# Patient Record
Sex: Female | Born: 1955 | Race: White | Hispanic: No | Smoking: Never smoker
Health system: Southern US, Community
[De-identification: ages and names within clinical notes are randomized; demographics above are authoritative.]

## PROBLEM LIST (undated history)

## (undated) DIAGNOSIS — M810 Age-related osteoporosis without current pathological fracture: Secondary | ICD-10-CM

## (undated) DIAGNOSIS — M5136 Other intervertebral disc degeneration, lumbar region: Secondary | ICD-10-CM

## (undated) DIAGNOSIS — R000 Tachycardia, unspecified: Secondary | ICD-10-CM

## (undated) DIAGNOSIS — R7303 Prediabetes: Secondary | ICD-10-CM

## (undated) DIAGNOSIS — D509 Iron deficiency anemia, unspecified: Secondary | ICD-10-CM

## (undated) DIAGNOSIS — M5416 Radiculopathy, lumbar region: Secondary | ICD-10-CM

## (undated) DIAGNOSIS — K219 Gastro-esophageal reflux disease without esophagitis: Secondary | ICD-10-CM

## (undated) DIAGNOSIS — M51369 Other intervertebral disc degeneration, lumbar region without mention of lumbar back pain or lower extremity pain: Secondary | ICD-10-CM

## (undated) DIAGNOSIS — H8110 Benign paroxysmal vertigo, unspecified ear: Secondary | ICD-10-CM

## (undated) DIAGNOSIS — R112 Nausea with vomiting, unspecified: Secondary | ICD-10-CM

## (undated) DIAGNOSIS — I10 Essential (primary) hypertension: Secondary | ICD-10-CM

## (undated) DIAGNOSIS — L309 Dermatitis, unspecified: Secondary | ICD-10-CM

## (undated) DIAGNOSIS — K644 Residual hemorrhoidal skin tags: Secondary | ICD-10-CM

## (undated) DIAGNOSIS — R002 Palpitations: Secondary | ICD-10-CM

## (undated) DIAGNOSIS — E785 Hyperlipidemia, unspecified: Secondary | ICD-10-CM

## (undated) DIAGNOSIS — T8859XA Other complications of anesthesia, initial encounter: Secondary | ICD-10-CM

## (undated) DIAGNOSIS — D696 Thrombocytopenia, unspecified: Secondary | ICD-10-CM

## (undated) DIAGNOSIS — K7469 Other cirrhosis of liver: Secondary | ICD-10-CM

## (undated) DIAGNOSIS — L409 Psoriasis, unspecified: Secondary | ICD-10-CM

## (undated) DIAGNOSIS — M1990 Unspecified osteoarthritis, unspecified site: Secondary | ICD-10-CM

## (undated) DIAGNOSIS — K7460 Unspecified cirrhosis of liver: Secondary | ICD-10-CM

## (undated) DIAGNOSIS — F419 Anxiety disorder, unspecified: Secondary | ICD-10-CM

## (undated) DIAGNOSIS — M4800 Spinal stenosis, site unspecified: Secondary | ICD-10-CM

## (undated) DIAGNOSIS — M549 Dorsalgia, unspecified: Secondary | ICD-10-CM

## (undated) DIAGNOSIS — Z9889 Other specified postprocedural states: Secondary | ICD-10-CM

## (undated) HISTORY — DX: Tachycardia, unspecified: R00.0

## (undated) HISTORY — DX: Dermatitis, unspecified: L30.9

## (undated) HISTORY — DX: Other intervertebral disc degeneration, lumbar region without mention of lumbar back pain or lower extremity pain: M51.369

## (undated) HISTORY — DX: Radiculopathy, lumbar region: M54.16

## (undated) HISTORY — DX: Thrombocytopenia, unspecified: D69.6

## (undated) HISTORY — DX: Hyperlipidemia, unspecified: E78.5

## (undated) HISTORY — DX: Nonalcoholic steatohepatitis (NASH): K74.60

## (undated) HISTORY — DX: Residual hemorrhoidal skin tags: K64.4

## (undated) HISTORY — DX: Benign paroxysmal vertigo, unspecified ear: H81.10

## (undated) HISTORY — DX: Psoriasis, unspecified: L40.9

## (undated) HISTORY — DX: Iron deficiency anemia, unspecified: D50.9

## (undated) HISTORY — DX: Age-related osteoporosis without current pathological fracture: M81.0

## (undated) HISTORY — DX: Palpitations: R00.2

## (undated) HISTORY — DX: Other intervertebral disc degeneration, lumbar region: M51.36

## (undated) HISTORY — DX: Unspecified osteoarthritis, unspecified site: M19.90

## (undated) HISTORY — PX: COLONOSCOPY: SHX174

## (undated) HISTORY — DX: Other cirrhosis of liver: K74.69

---

## 2015-09-20 DIAGNOSIS — M25572 Pain in left ankle and joints of left foot: Secondary | ICD-10-CM | POA: Diagnosis not present

## 2015-11-09 DIAGNOSIS — M5136 Other intervertebral disc degeneration, lumbar region: Secondary | ICD-10-CM | POA: Diagnosis not present

## 2015-11-13 DIAGNOSIS — M5126 Other intervertebral disc displacement, lumbar region: Secondary | ICD-10-CM | POA: Diagnosis not present

## 2015-11-13 DIAGNOSIS — M5136 Other intervertebral disc degeneration, lumbar region: Secondary | ICD-10-CM | POA: Diagnosis not present

## 2015-11-13 DIAGNOSIS — M4806 Spinal stenosis, lumbar region: Secondary | ICD-10-CM | POA: Diagnosis not present

## 2015-11-14 DIAGNOSIS — J0100 Acute maxillary sinusitis, unspecified: Secondary | ICD-10-CM | POA: Diagnosis not present

## 2015-11-15 DIAGNOSIS — M5136 Other intervertebral disc degeneration, lumbar region: Secondary | ICD-10-CM | POA: Diagnosis not present

## 2015-11-17 DIAGNOSIS — M5136 Other intervertebral disc degeneration, lumbar region: Secondary | ICD-10-CM | POA: Diagnosis not present

## 2015-11-17 DIAGNOSIS — M5116 Intervertebral disc disorders with radiculopathy, lumbar region: Secondary | ICD-10-CM | POA: Diagnosis not present

## 2015-11-17 DIAGNOSIS — M5416 Radiculopathy, lumbar region: Secondary | ICD-10-CM | POA: Diagnosis not present

## 2015-12-22 DIAGNOSIS — Z1231 Encounter for screening mammogram for malignant neoplasm of breast: Secondary | ICD-10-CM | POA: Diagnosis not present

## 2016-02-15 DIAGNOSIS — M5136 Other intervertebral disc degeneration, lumbar region: Secondary | ICD-10-CM | POA: Diagnosis not present

## 2016-03-16 DIAGNOSIS — R7301 Impaired fasting glucose: Secondary | ICD-10-CM | POA: Diagnosis not present

## 2016-03-16 DIAGNOSIS — F419 Anxiety disorder, unspecified: Secondary | ICD-10-CM | POA: Diagnosis not present

## 2016-03-16 DIAGNOSIS — E785 Hyperlipidemia, unspecified: Secondary | ICD-10-CM | POA: Diagnosis not present

## 2016-03-16 DIAGNOSIS — K760 Fatty (change of) liver, not elsewhere classified: Secondary | ICD-10-CM | POA: Diagnosis not present

## 2016-05-09 DIAGNOSIS — Z1151 Encounter for screening for human papillomavirus (HPV): Secondary | ICD-10-CM | POA: Diagnosis not present

## 2016-05-09 DIAGNOSIS — K644 Residual hemorrhoidal skin tags: Secondary | ICD-10-CM | POA: Diagnosis not present

## 2016-05-09 DIAGNOSIS — Z01419 Encounter for gynecological examination (general) (routine) without abnormal findings: Secondary | ICD-10-CM | POA: Diagnosis not present

## 2016-05-09 DIAGNOSIS — K640 First degree hemorrhoids: Secondary | ICD-10-CM | POA: Diagnosis not present

## 2016-05-27 DIAGNOSIS — H6692 Otitis media, unspecified, left ear: Secondary | ICD-10-CM | POA: Diagnosis not present

## 2016-05-27 DIAGNOSIS — I10 Essential (primary) hypertension: Secondary | ICD-10-CM | POA: Diagnosis not present

## 2016-05-27 DIAGNOSIS — Z6839 Body mass index (BMI) 39.0-39.9, adult: Secondary | ICD-10-CM | POA: Diagnosis not present

## 2016-09-14 DIAGNOSIS — R7301 Impaired fasting glucose: Secondary | ICD-10-CM | POA: Diagnosis not present

## 2016-09-14 DIAGNOSIS — E785 Hyperlipidemia, unspecified: Secondary | ICD-10-CM | POA: Diagnosis not present

## 2016-09-14 DIAGNOSIS — K760 Fatty (change of) liver, not elsewhere classified: Secondary | ICD-10-CM | POA: Diagnosis not present

## 2016-09-14 DIAGNOSIS — F419 Anxiety disorder, unspecified: Secondary | ICD-10-CM | POA: Diagnosis not present

## 2016-09-14 DIAGNOSIS — Z1389 Encounter for screening for other disorder: Secondary | ICD-10-CM | POA: Diagnosis not present

## 2017-03-22 DIAGNOSIS — F419 Anxiety disorder, unspecified: Secondary | ICD-10-CM | POA: Diagnosis not present

## 2017-03-22 DIAGNOSIS — E785 Hyperlipidemia, unspecified: Secondary | ICD-10-CM | POA: Diagnosis not present

## 2017-03-22 DIAGNOSIS — K760 Fatty (change of) liver, not elsewhere classified: Secondary | ICD-10-CM | POA: Diagnosis not present

## 2017-03-22 DIAGNOSIS — R7301 Impaired fasting glucose: Secondary | ICD-10-CM | POA: Diagnosis not present

## 2017-04-18 DIAGNOSIS — H01021 Squamous blepharitis right upper eyelid: Secondary | ICD-10-CM | POA: Diagnosis not present

## 2017-04-18 DIAGNOSIS — H11153 Pinguecula, bilateral: Secondary | ICD-10-CM | POA: Diagnosis not present

## 2017-04-18 DIAGNOSIS — H01024 Squamous blepharitis left upper eyelid: Secondary | ICD-10-CM | POA: Diagnosis not present

## 2017-04-18 DIAGNOSIS — H11441 Conjunctival cysts, right eye: Secondary | ICD-10-CM | POA: Diagnosis not present

## 2017-05-02 DIAGNOSIS — H11153 Pinguecula, bilateral: Secondary | ICD-10-CM | POA: Diagnosis not present

## 2017-05-02 DIAGNOSIS — H11441 Conjunctival cysts, right eye: Secondary | ICD-10-CM | POA: Diagnosis not present

## 2017-05-02 DIAGNOSIS — H01024 Squamous blepharitis left upper eyelid: Secondary | ICD-10-CM | POA: Diagnosis not present

## 2017-05-02 DIAGNOSIS — H01021 Squamous blepharitis right upper eyelid: Secondary | ICD-10-CM | POA: Diagnosis not present

## 2017-07-04 DIAGNOSIS — H11153 Pinguecula, bilateral: Secondary | ICD-10-CM | POA: Diagnosis not present

## 2017-07-04 DIAGNOSIS — H25013 Cortical age-related cataract, bilateral: Secondary | ICD-10-CM | POA: Diagnosis not present

## 2017-07-04 DIAGNOSIS — H52223 Regular astigmatism, bilateral: Secondary | ICD-10-CM | POA: Diagnosis not present

## 2017-07-04 DIAGNOSIS — H1045 Other chronic allergic conjunctivitis: Secondary | ICD-10-CM | POA: Diagnosis not present

## 2017-07-04 DIAGNOSIS — H524 Presbyopia: Secondary | ICD-10-CM | POA: Diagnosis not present

## 2017-07-04 DIAGNOSIS — H5203 Hypermetropia, bilateral: Secondary | ICD-10-CM | POA: Diagnosis not present

## 2017-07-04 DIAGNOSIS — H2513 Age-related nuclear cataract, bilateral: Secondary | ICD-10-CM | POA: Diagnosis not present

## 2017-09-05 DIAGNOSIS — R229 Localized swelling, mass and lump, unspecified: Secondary | ICD-10-CM | POA: Diagnosis not present

## 2017-09-05 DIAGNOSIS — N6321 Unspecified lump in the left breast, upper outer quadrant: Secondary | ICD-10-CM | POA: Diagnosis not present

## 2017-09-05 DIAGNOSIS — R928 Other abnormal and inconclusive findings on diagnostic imaging of breast: Secondary | ICD-10-CM | POA: Diagnosis not present

## 2017-10-10 DIAGNOSIS — E785 Hyperlipidemia, unspecified: Secondary | ICD-10-CM | POA: Diagnosis not present

## 2017-10-10 DIAGNOSIS — K760 Fatty (change of) liver, not elsewhere classified: Secondary | ICD-10-CM | POA: Diagnosis not present

## 2017-10-10 DIAGNOSIS — F419 Anxiety disorder, unspecified: Secondary | ICD-10-CM | POA: Diagnosis not present

## 2017-10-10 DIAGNOSIS — R7301 Impaired fasting glucose: Secondary | ICD-10-CM | POA: Diagnosis not present

## 2018-01-07 DIAGNOSIS — Z01818 Encounter for other preprocedural examination: Secondary | ICD-10-CM | POA: Diagnosis not present

## 2018-02-06 DIAGNOSIS — D125 Benign neoplasm of sigmoid colon: Secondary | ICD-10-CM | POA: Diagnosis not present

## 2018-02-06 DIAGNOSIS — K644 Residual hemorrhoidal skin tags: Secondary | ICD-10-CM | POA: Diagnosis not present

## 2018-02-06 DIAGNOSIS — I10 Essential (primary) hypertension: Secondary | ICD-10-CM | POA: Diagnosis not present

## 2018-02-06 DIAGNOSIS — K635 Polyp of colon: Secondary | ICD-10-CM | POA: Diagnosis not present

## 2018-02-06 DIAGNOSIS — D124 Benign neoplasm of descending colon: Secondary | ICD-10-CM | POA: Diagnosis not present

## 2018-02-06 DIAGNOSIS — Z79899 Other long term (current) drug therapy: Secondary | ICD-10-CM | POA: Diagnosis not present

## 2018-02-06 DIAGNOSIS — M1990 Unspecified osteoarthritis, unspecified site: Secondary | ICD-10-CM | POA: Diagnosis not present

## 2018-02-06 DIAGNOSIS — Z800 Family history of malignant neoplasm of digestive organs: Secondary | ICD-10-CM | POA: Diagnosis not present

## 2018-02-06 DIAGNOSIS — D649 Anemia, unspecified: Secondary | ICD-10-CM | POA: Diagnosis not present

## 2018-02-06 DIAGNOSIS — F419 Anxiety disorder, unspecified: Secondary | ICD-10-CM | POA: Diagnosis not present

## 2018-02-06 DIAGNOSIS — K624 Stenosis of anus and rectum: Secondary | ICD-10-CM | POA: Diagnosis not present

## 2018-02-06 DIAGNOSIS — K7689 Other specified diseases of liver: Secondary | ICD-10-CM | POA: Diagnosis not present

## 2018-02-06 DIAGNOSIS — Z1211 Encounter for screening for malignant neoplasm of colon: Secondary | ICD-10-CM | POA: Diagnosis not present

## 2018-02-06 DIAGNOSIS — K219 Gastro-esophageal reflux disease without esophagitis: Secondary | ICD-10-CM | POA: Diagnosis not present

## 2018-02-15 DIAGNOSIS — L2089 Other atopic dermatitis: Secondary | ICD-10-CM | POA: Diagnosis not present

## 2018-03-13 DIAGNOSIS — R928 Other abnormal and inconclusive findings on diagnostic imaging of breast: Secondary | ICD-10-CM | POA: Diagnosis not present

## 2018-03-13 DIAGNOSIS — N6489 Other specified disorders of breast: Secondary | ICD-10-CM | POA: Diagnosis not present

## 2018-04-25 DIAGNOSIS — Z1331 Encounter for screening for depression: Secondary | ICD-10-CM | POA: Diagnosis not present

## 2018-04-25 DIAGNOSIS — F419 Anxiety disorder, unspecified: Secondary | ICD-10-CM | POA: Diagnosis not present

## 2018-04-25 DIAGNOSIS — R7301 Impaired fasting glucose: Secondary | ICD-10-CM | POA: Diagnosis not present

## 2018-04-25 DIAGNOSIS — K760 Fatty (change of) liver, not elsewhere classified: Secondary | ICD-10-CM | POA: Diagnosis not present

## 2018-04-25 DIAGNOSIS — E785 Hyperlipidemia, unspecified: Secondary | ICD-10-CM | POA: Diagnosis not present

## 2018-05-02 DIAGNOSIS — R161 Splenomegaly, not elsewhere classified: Secondary | ICD-10-CM | POA: Diagnosis not present

## 2018-05-02 DIAGNOSIS — K760 Fatty (change of) liver, not elsewhere classified: Secondary | ICD-10-CM | POA: Diagnosis not present

## 2018-05-02 DIAGNOSIS — K8020 Calculus of gallbladder without cholecystitis without obstruction: Secondary | ICD-10-CM | POA: Diagnosis not present

## 2018-11-27 DIAGNOSIS — F419 Anxiety disorder, unspecified: Secondary | ICD-10-CM | POA: Diagnosis not present

## 2018-11-27 DIAGNOSIS — K760 Fatty (change of) liver, not elsewhere classified: Secondary | ICD-10-CM | POA: Diagnosis not present

## 2018-11-27 DIAGNOSIS — E785 Hyperlipidemia, unspecified: Secondary | ICD-10-CM | POA: Diagnosis not present

## 2018-12-11 DIAGNOSIS — R7301 Impaired fasting glucose: Secondary | ICD-10-CM | POA: Diagnosis not present

## 2019-02-19 DIAGNOSIS — Z1231 Encounter for screening mammogram for malignant neoplasm of breast: Secondary | ICD-10-CM | POA: Diagnosis not present

## 2019-04-12 DIAGNOSIS — L820 Inflamed seborrheic keratosis: Secondary | ICD-10-CM | POA: Diagnosis not present

## 2019-04-12 DIAGNOSIS — L739 Follicular disorder, unspecified: Secondary | ICD-10-CM | POA: Diagnosis not present

## 2019-04-12 DIAGNOSIS — L570 Actinic keratosis: Secondary | ICD-10-CM | POA: Diagnosis not present

## 2020-02-10 DIAGNOSIS — H25013 Cortical age-related cataract, bilateral: Secondary | ICD-10-CM | POA: Diagnosis not present

## 2020-02-10 DIAGNOSIS — H2513 Age-related nuclear cataract, bilateral: Secondary | ICD-10-CM | POA: Diagnosis not present

## 2020-02-10 DIAGNOSIS — H40013 Open angle with borderline findings, low risk, bilateral: Secondary | ICD-10-CM | POA: Diagnosis not present

## 2020-02-10 DIAGNOSIS — H5213 Myopia, bilateral: Secondary | ICD-10-CM | POA: Diagnosis not present

## 2020-04-27 DIAGNOSIS — H2513 Age-related nuclear cataract, bilateral: Secondary | ICD-10-CM | POA: Diagnosis not present

## 2020-04-27 DIAGNOSIS — H25013 Cortical age-related cataract, bilateral: Secondary | ICD-10-CM | POA: Diagnosis not present

## 2020-04-27 DIAGNOSIS — H40013 Open angle with borderline findings, low risk, bilateral: Secondary | ICD-10-CM | POA: Diagnosis not present

## 2020-05-23 DIAGNOSIS — Z1231 Encounter for screening mammogram for malignant neoplasm of breast: Secondary | ICD-10-CM | POA: Diagnosis not present

## 2020-08-11 DIAGNOSIS — K760 Fatty (change of) liver, not elsewhere classified: Secondary | ICD-10-CM | POA: Diagnosis not present

## 2020-08-11 DIAGNOSIS — E785 Hyperlipidemia, unspecified: Secondary | ICD-10-CM | POA: Diagnosis not present

## 2020-08-11 DIAGNOSIS — I10 Essential (primary) hypertension: Secondary | ICD-10-CM | POA: Diagnosis not present

## 2020-08-11 DIAGNOSIS — F419 Anxiety disorder, unspecified: Secondary | ICD-10-CM | POA: Diagnosis not present

## 2020-08-11 DIAGNOSIS — Z23 Encounter for immunization: Secondary | ICD-10-CM | POA: Diagnosis not present

## 2020-08-11 DIAGNOSIS — K219 Gastro-esophageal reflux disease without esophagitis: Secondary | ICD-10-CM | POA: Diagnosis not present

## 2020-08-11 DIAGNOSIS — R7301 Impaired fasting glucose: Secondary | ICD-10-CM | POA: Diagnosis not present

## 2020-08-11 DIAGNOSIS — Z1331 Encounter for screening for depression: Secondary | ICD-10-CM | POA: Diagnosis not present

## 2020-08-18 DIAGNOSIS — K760 Fatty (change of) liver, not elsewhere classified: Secondary | ICD-10-CM | POA: Diagnosis not present

## 2020-08-18 DIAGNOSIS — K8020 Calculus of gallbladder without cholecystitis without obstruction: Secondary | ICD-10-CM | POA: Diagnosis not present

## 2020-09-18 ENCOUNTER — Emergency Department (HOSPITAL_BASED_OUTPATIENT_CLINIC_OR_DEPARTMENT_OTHER)
Admission: EM | Admit: 2020-09-18 | Discharge: 2020-09-18 | Disposition: A | Discharge status: Home / Self Care | Payer: Medicare HMO | Attending: Emergency Medicine | Admitting: Emergency Medicine

## 2020-09-18 ENCOUNTER — Emergency Department (HOSPITAL_BASED_OUTPATIENT_CLINIC_OR_DEPARTMENT_OTHER): Payer: Medicare HMO

## 2020-09-18 ENCOUNTER — Other Ambulatory Visit: Payer: Self-pay

## 2020-09-18 ENCOUNTER — Encounter (HOSPITAL_BASED_OUTPATIENT_CLINIC_OR_DEPARTMENT_OTHER): Payer: Self-pay | Admitting: *Deleted

## 2020-09-18 DIAGNOSIS — W1839XA Other fall on same level, initial encounter: Secondary | ICD-10-CM | POA: Diagnosis not present

## 2020-09-18 DIAGNOSIS — S42211A Unspecified displaced fracture of surgical neck of right humerus, initial encounter for closed fracture: Secondary | ICD-10-CM | POA: Diagnosis not present

## 2020-09-18 DIAGNOSIS — S42252A Displaced fracture of greater tuberosity of left humerus, initial encounter for closed fracture: Secondary | ICD-10-CM | POA: Diagnosis not present

## 2020-09-18 DIAGNOSIS — S42201A Unspecified fracture of upper end of right humerus, initial encounter for closed fracture: Secondary | ICD-10-CM | POA: Insufficient documentation

## 2020-09-18 DIAGNOSIS — M25512 Pain in left shoulder: Secondary | ICD-10-CM | POA: Diagnosis not present

## 2020-09-18 DIAGNOSIS — S42292A Other displaced fracture of upper end of left humerus, initial encounter for closed fracture: Secondary | ICD-10-CM | POA: Diagnosis not present

## 2020-09-18 DIAGNOSIS — S42291A Other displaced fracture of upper end of right humerus, initial encounter for closed fracture: Secondary | ICD-10-CM

## 2020-09-18 DIAGNOSIS — S42202A Unspecified fracture of upper end of left humerus, initial encounter for closed fracture: Secondary | ICD-10-CM | POA: Insufficient documentation

## 2020-09-18 DIAGNOSIS — I10 Essential (primary) hypertension: Secondary | ICD-10-CM | POA: Insufficient documentation

## 2020-09-18 DIAGNOSIS — S4991XA Unspecified injury of right shoulder and upper arm, initial encounter: Secondary | ICD-10-CM | POA: Diagnosis present

## 2020-09-18 DIAGNOSIS — S42251A Displaced fracture of greater tuberosity of right humerus, initial encounter for closed fracture: Secondary | ICD-10-CM | POA: Diagnosis not present

## 2020-09-18 DIAGNOSIS — S42212A Unspecified displaced fracture of surgical neck of left humerus, initial encounter for closed fracture: Secondary | ICD-10-CM | POA: Diagnosis not present

## 2020-09-18 HISTORY — DX: Spinal stenosis, site unspecified: M48.00

## 2020-09-18 HISTORY — DX: Dorsalgia, unspecified: M54.9

## 2020-09-18 HISTORY — DX: Essential (primary) hypertension: I10

## 2020-09-18 HISTORY — DX: Gastro-esophageal reflux disease without esophagitis: K21.9

## 2020-09-18 MED ORDER — MORPHINE SULFATE 15 MG PO TABS: 7.5000 mg | ORAL_TABLET | ORAL | 0 refills | Status: DC | PRN

## 2020-09-18 MED ORDER — ACETAMINOPHEN 500 MG PO TABS
1000.0000 mg | ORAL_TABLET | Freq: Once | ORAL | Status: AC
  Administered 2020-09-18: 1000 mg via ORAL
  Filled 2020-09-18: qty 2

## 2020-09-18 MED ORDER — OXYCODONE HCL 5 MG PO TABS
5.0000 mg | ORAL_TABLET | Freq: Once | ORAL | Status: AC
  Administered 2020-09-18: 5 mg via ORAL
  Filled 2020-09-18: qty 1

## 2020-09-18 NOTE — ED Provider Notes (Signed)
[facility] EMERGENCY DEPARTMENT Provider Note   CSN: [id number] Arrival date & time: 09/18/20  1255     History Chief Complaint  Patient presents with  . Fall    Abigail Luna is a 65 y.o. female.  65 yo F with a chief complaint of a fall.  The patient tripped over a concrete divider between parking spaces and landed on her left shoulder.  Complaining of pain to the left shoulder and having difficulty lifting it up.  She also has pain on the right side as well.  Denies head injury denies neck pain denies chest pain denies abdominal pain denies back pain.  The history is provided by the patient.  Fall This is a new problem. The current episode started 1 to 2 hours ago. The problem occurs constantly. The problem has not changed since onset.Pertinent negatives include no chest pain, no headaches and no shortness of breath. The symptoms are aggravated by bending and twisting. Nothing relieves the symptoms. She has tried nothing for the symptoms. The treatment provided no relief.       Past Medical History:  Diagnosis Date  . Back pain   . GERD (gastroesophageal reflux disease)   . Hypertension   . Spinal stenosis     There are no problems to display for this patient.   History reviewed. No pertinent surgical history.   OB History   No obstetric history on file.     No family history on file.  Social History   Tobacco Use  . Smoking status: Never Smoker  Vaping Use  . Vaping Use: Never used  Substance Use Topics  . Alcohol use: Not Currently  . Drug use: Not Currently    Home Medications Prior to Admission medications   Medication Sig Start Date End Date Taking? Authorizing Provider  morphine (MSIR) 15 MG tablet Take 0.5 tablets (7.5 mg total) by mouth every 4 (four) hours as needed for severe pain. 09/18/20  Yes Melene Plan, DO    Allergies    Patient has no known allergies.  Review of Systems   Review of Systems  Constitutional: Negative for chills  and fever.  HENT: Negative for congestion and rhinorrhea.   Eyes: Negative for redness and visual disturbance.  Respiratory: Negative for shortness of breath and wheezing.   Cardiovascular: Negative for chest pain and palpitations.  Gastrointestinal: Negative for nausea and vomiting.  Genitourinary: Negative for dysuria and urgency.  Musculoskeletal: Positive for arthralgias and myalgias.  Skin: Negative for pallor and wound.  Neurological: Negative for dizziness and headaches.    Physical Exam Updated Vital Signs BP (!) 115/46 (BP Location: Left Arm)   Pulse 67   Temp 98.2 F (36.8 C) (Oral)   Resp 16   Ht 5\' 1"  (1.549 m)   Wt 99.8 kg   SpO2 100%   BMI 41.57 kg/m   Physical Exam Vitals and nursing note reviewed.  Constitutional:      General: She is not in acute distress.    Appearance: She is well-developed. She is not diaphoretic.     Comments: BMI 41.  HENT:     Head: Normocephalic and atraumatic.  Eyes:     Pupils: Pupils are equal, round, and reactive to light.  Cardiovascular:     Rate and Rhythm: Normal rate and regular rhythm.     Heart sounds: No murmur heard. No friction rub. No gallop.   Pulmonary:     Effort: Pulmonary effort is normal.  Breath sounds: No wheezing or rales.  Abdominal:     General: There is no distension.     Palpations: Abdomen is soft.     Tenderness: There is no abdominal tenderness.  Musculoskeletal:        General: Tenderness present.     Cervical back: Normal range of motion and neck supple.     Comments: Pain and tenderness to bilateral proximal humeral areas.  Pulse motor and sensation intact bilaterally.  No midline C-spine tenderness able to rotate her head 45 degrees in either direction without pain.  No signs of trauma to the head.  Skin:    General: Skin is warm and dry.  Neurological:     Mental Status: She is alert and oriented to person, place, and time.  Psychiatric:        Behavior: Behavior normal.     ED  Results / Procedures / Treatments   Labs (all labs ordered are listed, but only abnormal results are displayed) Labs Reviewed - No data to display  EKG None  Radiology DG Shoulder Right  Result Date: 09/18/2020 CLINICAL DATA:  Pain following fall EXAM: RIGHT SHOULDER - 2+ VIEW COMPARISON:  None. FINDINGS: Frontal, oblique, and Y scapular images were obtained. There is a fracture through the proximal humeral metaphysis. There is avulsion of the greater tuberosity. No other fractures are evident. No dislocation. There is mild generalized osteoarthritic change. Calcification is noted in the superior aspect of the acromioclavicular joint which may represent residua of prior trauma. Visualized right lung clear. IMPRESSION: Transversely oriented fracture proximal humeral metaphysis. Avulsion of the greater tuberosity. No dislocation. 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 Note that mild bony overgrowth along the acromioclavicular joint inferiorly may lead to increased risk of impingement syndrome. Electronically Signed   By: Bretta Bang III M.D.   On: 09/18/2020 15:09   DG Shoulder Left  Result Date: 09/18/2020 CLINICAL DATA:  Fall, left arm pain, limited range of motion, initial encounter. EXAM: LEFT SHOULDER - 2+ VIEW COMPARISON:  None. FINDINGS: There is a comminuted fracture of the humeral head and neck with impaction and slight displacement of the greater tuberosity. A triangular lucency is seen along the superior aspect of the glenohumeral joint, suggesting a lipohemarthrosis. Difficult to exclude an associated fracture of the glenoid. Acromioclavicular joint is intact. Visualized left chest is grossly unremarkable. IMPRESSION: 1. Comminuted and impacted fracture of the humeral head/neck with a probable associated lipohemarthrosis. 2. Difficult to exclude an associated fracture of the glenoid. Electronically Signed   By: Leanna Battles M.D.   On: 09/18/2020 14:00     Procedures Procedures   Medications Ordered in ED Medications  acetaminophen (TYLENOL) tablet 1,000 mg (1,000 mg Oral Given 09/18/20 1404)  oxyCODONE (Oxy IR/ROXICODONE) immediate release tablet 5 mg (5 mg Oral Given 09/18/20 1404)    ED Course  I have reviewed the triage vital signs and the nursing notes.  Pertinent labs & imaging results that were available during my care of the patient were reviewed by me and considered in my medical decision making (see chart for details).    MDM Rules/Calculators/A&P                          65 yo F with a cc of a fall.  Patient tripped over a concrete barrier and landed on her left shoulder.  Planing of pain to both shoulders on arrival.  She denies any injury to  the right shoulder.  Has significant pain at the proximal humerus and difficulty with raising it.  Plain film is consistent with bilateral humeral fractures.  I discussed the case with orthopedics, Dr Linna Caprice who recommended CT imaging of the shoulder and then follow-up as an outpatient.  3:34 PM:  I have discussed the diagnosis/risks/treatment options with the patient and believe the pt to be eligible for discharge home to follow-up with PCP. We also discussed returning to the ED immediately if new or worsening sx occur. We discussed the sx which are most concerning (e.g., sudden worsening pain, fever, inability to tolerate by mouth) that necessitate immediate return. Medications administered to the patient during their visit and any new prescriptions provided to the patient are listed below.  Medications given during this visit Medications  acetaminophen (TYLENOL) tablet 1,000 mg (1,000 mg Oral Given 09/18/20 1404)  oxyCODONE (Oxy IR/ROXICODONE) immediate release tablet 5 mg (5 mg Oral Given 09/18/20 1404)     The patient appears reasonably screen and/or stabilized for discharge and I doubt any other medical condition or other [facility] requiring further screening, evaluation, or treatment in  the ED at this time prior to discharge.   Final Clinical Impression(s) / ED Diagnoses Final diagnoses:  Humeral head fracture, left, closed, initial encounter  Humeral head fracture, right, closed, initial encounter    Rx / DC Orders ED Discharge Orders         Ordered    morphine (MSIR) 15 MG tablet  Every 4 hours PRN        09/18/20 1523           Melene Plan, DO 09/18/20 1534

## 2020-09-18 NOTE — ED Notes (Signed)
Unable to obtain BP d/t bilateral slings

## 2020-09-18 NOTE — ED Triage Notes (Signed)
C/o fall from standing landing on concrete x 2 hrs ago , c/o left shoulder injury

## 2020-09-18 NOTE — ED Notes (Addendum)
Pt given water and crackers, transported to CT

## 2020-09-18 NOTE — Discharge Instructions (Signed)

## 2020-09-21 DIAGNOSIS — S52515A Nondisplaced fracture of left radial styloid process, initial encounter for closed fracture: Secondary | ICD-10-CM | POA: Diagnosis not present

## 2020-09-21 DIAGNOSIS — S42301S Unspecified fracture of shaft of humerus, right arm, sequela: Secondary | ICD-10-CM | POA: Diagnosis not present

## 2020-09-21 DIAGNOSIS — S42202S Unspecified fracture of upper end of left humerus, sequela: Secondary | ICD-10-CM | POA: Diagnosis not present

## 2020-09-21 DIAGNOSIS — M25532 Pain in left wrist: Secondary | ICD-10-CM | POA: Diagnosis not present

## 2020-09-25 ENCOUNTER — Encounter (HOSPITAL_COMMUNITY): Payer: Self-pay | Admitting: Orthopedic Surgery

## 2020-09-25 ENCOUNTER — Other Ambulatory Visit (HOSPITAL_COMMUNITY)
Admission: RE | Admit: 2020-09-25 | Discharge: 2020-09-25 | Disposition: A | Discharge status: Home / Self Care | Payer: Medicare HMO | Source: Ambulatory Visit | Attending: Orthopedic Surgery | Admitting: Orthopedic Surgery

## 2020-09-25 DIAGNOSIS — S52592A Other fractures of lower end of left radius, initial encounter for closed fracture: Secondary | ICD-10-CM | POA: Diagnosis present

## 2020-09-25 DIAGNOSIS — N179 Acute kidney failure, unspecified: Secondary | ICD-10-CM | POA: Diagnosis present

## 2020-09-25 DIAGNOSIS — W19XXXA Unspecified fall, initial encounter: Secondary | ICD-10-CM | POA: Diagnosis not present

## 2020-09-25 DIAGNOSIS — W010XXA Fall on same level from slipping, tripping and stumbling without subsequent striking against object, initial encounter: Secondary | ICD-10-CM | POA: Diagnosis not present

## 2020-09-25 DIAGNOSIS — S42201D Unspecified fracture of upper end of right humerus, subsequent encounter for fracture with routine healing: Secondary | ICD-10-CM | POA: Diagnosis not present

## 2020-09-25 DIAGNOSIS — S42294A Other nondisplaced fracture of upper end of right humerus, initial encounter for closed fracture: Secondary | ICD-10-CM | POA: Diagnosis present

## 2020-09-25 DIAGNOSIS — K760 Fatty (change of) liver, not elsewhere classified: Secondary | ICD-10-CM | POA: Diagnosis present

## 2020-09-25 DIAGNOSIS — D6959 Other secondary thrombocytopenia: Secondary | ICD-10-CM | POA: Diagnosis present

## 2020-09-25 DIAGNOSIS — W1830XA Fall on same level, unspecified, initial encounter: Secondary | ICD-10-CM | POA: Diagnosis present

## 2020-09-25 DIAGNOSIS — Z20822 Contact with and (suspected) exposure to covid-19: Secondary | ICD-10-CM | POA: Diagnosis present

## 2020-09-25 DIAGNOSIS — F319 Bipolar disorder, unspecified: Secondary | ICD-10-CM | POA: Diagnosis present

## 2020-09-25 DIAGNOSIS — Z01812 Encounter for preprocedural laboratory examination: Secondary | ICD-10-CM | POA: Insufficient documentation

## 2020-09-25 DIAGNOSIS — M25532 Pain in left wrist: Secondary | ICD-10-CM | POA: Diagnosis not present

## 2020-09-25 DIAGNOSIS — R2681 Unsteadiness on feet: Secondary | ICD-10-CM | POA: Diagnosis not present

## 2020-09-25 DIAGNOSIS — F419 Anxiety disorder, unspecified: Secondary | ICD-10-CM | POA: Diagnosis present

## 2020-09-25 DIAGNOSIS — S52502D Unspecified fracture of the lower end of left radius, subsequent encounter for closed fracture with routine healing: Secondary | ICD-10-CM | POA: Diagnosis not present

## 2020-09-25 DIAGNOSIS — Z6841 Body Mass Index (BMI) 40.0 and over, adult: Secondary | ICD-10-CM | POA: Diagnosis not present

## 2020-09-25 DIAGNOSIS — G8918 Other acute postprocedural pain: Secondary | ICD-10-CM | POA: Diagnosis not present

## 2020-09-25 DIAGNOSIS — S42242A 4-part fracture of surgical neck of left humerus, initial encounter for closed fracture: Secondary | ICD-10-CM | POA: Diagnosis present

## 2020-09-25 DIAGNOSIS — K5900 Constipation, unspecified: Secondary | ICD-10-CM | POA: Diagnosis present

## 2020-09-25 DIAGNOSIS — R7303 Prediabetes: Secondary | ICD-10-CM | POA: Diagnosis present

## 2020-09-25 DIAGNOSIS — Z7982 Long term (current) use of aspirin: Secondary | ICD-10-CM | POA: Diagnosis not present

## 2020-09-25 DIAGNOSIS — E871 Hypo-osmolality and hyponatremia: Secondary | ICD-10-CM | POA: Diagnosis present

## 2020-09-25 DIAGNOSIS — M25519 Pain in unspecified shoulder: Secondary | ICD-10-CM | POA: Diagnosis not present

## 2020-09-25 DIAGNOSIS — Z4889 Encounter for other specified surgical aftercare: Secondary | ICD-10-CM | POA: Diagnosis not present

## 2020-09-25 DIAGNOSIS — S42202A Unspecified fracture of upper end of left humerus, initial encounter for closed fracture: Secondary | ICD-10-CM | POA: Diagnosis not present

## 2020-09-25 DIAGNOSIS — Z79899 Other long term (current) drug therapy: Secondary | ICD-10-CM | POA: Diagnosis not present

## 2020-09-25 DIAGNOSIS — E669 Obesity, unspecified: Secondary | ICD-10-CM | POA: Diagnosis present

## 2020-09-25 DIAGNOSIS — Z96612 Presence of left artificial shoulder joint: Secondary | ICD-10-CM | POA: Diagnosis not present

## 2020-09-25 DIAGNOSIS — S42202D Unspecified fracture of upper end of left humerus, subsequent encounter for fracture with routine healing: Secondary | ICD-10-CM | POA: Diagnosis not present

## 2020-09-25 DIAGNOSIS — I10 Essential (primary) hypertension: Secondary | ICD-10-CM | POA: Diagnosis present

## 2020-09-25 DIAGNOSIS — I469 Cardiac arrest, cause unspecified: Secondary | ICD-10-CM | POA: Diagnosis not present

## 2020-09-25 DIAGNOSIS — K219 Gastro-esophageal reflux disease without esophagitis: Secondary | ICD-10-CM | POA: Diagnosis present

## 2020-09-25 DIAGNOSIS — Z471 Aftercare following joint replacement surgery: Secondary | ICD-10-CM | POA: Diagnosis not present

## 2020-09-25 DIAGNOSIS — M5459 Other low back pain: Secondary | ICD-10-CM | POA: Diagnosis not present

## 2020-09-25 NOTE — Progress Notes (Signed)
EKG: DOS CXR: na ECHO: denies Stress Test: denies Cardiac Cath: denies  Fasting Blood Sugar- na Checks Blood Sugar__na_ times a day  OSA/CPAP: No  ASA/Blood Thinners: No  Covid test 09/25/20  Anesthesia Review: No  Patient denies shortness of breath, fever, cough, and chest pain at PAT appointment.  Patient verbalized understanding of instructions provided today at the PAT appointment.  Patient asked to review instructions at home and day of surgery.

## 2020-09-26 ENCOUNTER — Inpatient Hospital Stay (HOSPITAL_COMMUNITY): Payer: Medicare HMO

## 2020-09-26 ENCOUNTER — Inpatient Hospital Stay (HOSPITAL_COMMUNITY): Payer: Medicare HMO | Admitting: Anesthesiology

## 2020-09-26 ENCOUNTER — Encounter (HOSPITAL_COMMUNITY)
Admission: RE | Disposition: A | Discharge status: Skilled Nursing Facility | Payer: Self-pay | Source: Home / Self Care | Attending: Orthopedic Surgery

## 2020-09-26 ENCOUNTER — Encounter (HOSPITAL_COMMUNITY): Payer: Self-pay | Admitting: Orthopedic Surgery

## 2020-09-26 ENCOUNTER — Inpatient Hospital Stay (HOSPITAL_COMMUNITY)
Admission: RE | Admit: 2020-09-26 | Discharge: 2020-10-04 | DRG: 483 | Disposition: A | Discharge status: Skilled Nursing Facility | Payer: Medicare HMO | Attending: Orthopedic Surgery | Admitting: Orthopedic Surgery

## 2020-09-26 DIAGNOSIS — S52592A Other fractures of lower end of left radius, initial encounter for closed fracture: Secondary | ICD-10-CM | POA: Diagnosis present

## 2020-09-26 DIAGNOSIS — R7303 Prediabetes: Secondary | ICD-10-CM | POA: Diagnosis present

## 2020-09-26 DIAGNOSIS — I10 Essential (primary) hypertension: Secondary | ICD-10-CM | POA: Diagnosis present

## 2020-09-26 DIAGNOSIS — E871 Hypo-osmolality and hyponatremia: Secondary | ICD-10-CM | POA: Diagnosis present

## 2020-09-26 DIAGNOSIS — Z7982 Long term (current) use of aspirin: Secondary | ICD-10-CM

## 2020-09-26 DIAGNOSIS — W19XXXA Unspecified fall, initial encounter: Secondary | ICD-10-CM | POA: Diagnosis not present

## 2020-09-26 DIAGNOSIS — G8918 Other acute postprocedural pain: Secondary | ICD-10-CM | POA: Diagnosis not present

## 2020-09-26 DIAGNOSIS — Z79899 Other long term (current) drug therapy: Secondary | ICD-10-CM

## 2020-09-26 DIAGNOSIS — N179 Acute kidney failure, unspecified: Secondary | ICD-10-CM | POA: Diagnosis present

## 2020-09-26 DIAGNOSIS — D6959 Other secondary thrombocytopenia: Secondary | ICD-10-CM | POA: Diagnosis present

## 2020-09-26 DIAGNOSIS — M5459 Other low back pain: Secondary | ICD-10-CM | POA: Diagnosis not present

## 2020-09-26 DIAGNOSIS — K760 Fatty (change of) liver, not elsewhere classified: Secondary | ICD-10-CM | POA: Diagnosis present

## 2020-09-26 DIAGNOSIS — S42202D Unspecified fracture of upper end of left humerus, subsequent encounter for fracture with routine healing: Secondary | ICD-10-CM | POA: Diagnosis not present

## 2020-09-26 DIAGNOSIS — K5900 Constipation, unspecified: Secondary | ICD-10-CM | POA: Diagnosis present

## 2020-09-26 DIAGNOSIS — K219 Gastro-esophageal reflux disease without esophagitis: Secondary | ICD-10-CM | POA: Diagnosis present

## 2020-09-26 DIAGNOSIS — R2681 Unsteadiness on feet: Secondary | ICD-10-CM | POA: Diagnosis not present

## 2020-09-26 DIAGNOSIS — S42294A Other nondisplaced fracture of upper end of right humerus, initial encounter for closed fracture: Secondary | ICD-10-CM | POA: Diagnosis present

## 2020-09-26 DIAGNOSIS — S42202A Unspecified fracture of upper end of left humerus, initial encounter for closed fracture: Secondary | ICD-10-CM | POA: Diagnosis not present

## 2020-09-26 DIAGNOSIS — W010XXA Fall on same level from slipping, tripping and stumbling without subsequent striking against object, initial encounter: Secondary | ICD-10-CM | POA: Diagnosis not present

## 2020-09-26 DIAGNOSIS — E669 Obesity, unspecified: Secondary | ICD-10-CM | POA: Diagnosis present

## 2020-09-26 DIAGNOSIS — S42242A 4-part fracture of surgical neck of left humerus, initial encounter for closed fracture: Secondary | ICD-10-CM | POA: Diagnosis present

## 2020-09-26 DIAGNOSIS — Z6841 Body Mass Index (BMI) 40.0 and over, adult: Secondary | ICD-10-CM | POA: Diagnosis not present

## 2020-09-26 DIAGNOSIS — F319 Bipolar disorder, unspecified: Secondary | ICD-10-CM | POA: Diagnosis present

## 2020-09-26 DIAGNOSIS — Z4889 Encounter for other specified surgical aftercare: Secondary | ICD-10-CM | POA: Diagnosis not present

## 2020-09-26 DIAGNOSIS — Z96612 Presence of left artificial shoulder joint: Secondary | ICD-10-CM | POA: Diagnosis not present

## 2020-09-26 DIAGNOSIS — I469 Cardiac arrest, cause unspecified: Secondary | ICD-10-CM | POA: Diagnosis not present

## 2020-09-26 DIAGNOSIS — Z471 Aftercare following joint replacement surgery: Secondary | ICD-10-CM | POA: Diagnosis not present

## 2020-09-26 DIAGNOSIS — S42201D Unspecified fracture of upper end of right humerus, subsequent encounter for fracture with routine healing: Secondary | ICD-10-CM | POA: Diagnosis not present

## 2020-09-26 DIAGNOSIS — F419 Anxiety disorder, unspecified: Secondary | ICD-10-CM | POA: Diagnosis present

## 2020-09-26 DIAGNOSIS — Z20822 Contact with and (suspected) exposure to covid-19: Secondary | ICD-10-CM | POA: Diagnosis present

## 2020-09-26 DIAGNOSIS — W1830XA Fall on same level, unspecified, initial encounter: Secondary | ICD-10-CM | POA: Diagnosis present

## 2020-09-26 DIAGNOSIS — M25532 Pain in left wrist: Secondary | ICD-10-CM | POA: Diagnosis not present

## 2020-09-26 DIAGNOSIS — M25519 Pain in unspecified shoulder: Secondary | ICD-10-CM | POA: Diagnosis not present

## 2020-09-26 DIAGNOSIS — S52502D Unspecified fracture of the lower end of left radius, subsequent encounter for closed fracture with routine healing: Secondary | ICD-10-CM | POA: Diagnosis not present

## 2020-09-26 HISTORY — DX: Other complications of anesthesia, initial encounter: T88.59XA

## 2020-09-26 HISTORY — DX: Anxiety disorder, unspecified: F41.9

## 2020-09-26 HISTORY — DX: Prediabetes: R73.03

## 2020-09-26 HISTORY — DX: Nausea with vomiting, unspecified: Z98.890

## 2020-09-26 HISTORY — DX: Other specified postprocedural states: R11.2

## 2020-09-26 HISTORY — DX: Other specified postprocedural states: Z98.890

## 2020-09-26 HISTORY — PX: REVERSE SHOULDER ARTHROPLASTY: SHX5054

## 2020-09-26 LAB — BASIC METABOLIC PANEL
Anion gap: 8 (ref 5–15)
BUN: 18 mg/dL (ref 8–23)
CO2: 22 mmol/L (ref 22–32)
Calcium: 9.9 mg/dL (ref 8.9–10.3)
Chloride: 103 mmol/L (ref 98–111)
Creatinine, Ser: 0.67 mg/dL (ref 0.44–1.00)
GFR, Estimated: >60 mL/min (ref >60)
Glucose, Bld: 136 mg/dL — ABNORMAL HIGH (ref 70–99)
Potassium: 4.7 mmol/L (ref 3.5–5.1)
Sodium: 133 mmol/L — ABNORMAL LOW (ref 135–145)

## 2020-09-26 LAB — CBC
HCT: 32.4 % — ABNORMAL LOW (ref 36.0–46.0)
Hemoglobin: 11.2 g/dL — ABNORMAL LOW (ref 12.0–15.0)
MCH: 34.3 pg — ABNORMAL HIGH (ref 26.0–34.0)
MCHC: 34.6 g/dL (ref 30.0–36.0)
MCV: 99.1 fL (ref 80.0–100.0)
RBC: 3.27 MIL/uL — ABNORMAL LOW (ref 3.87–5.11)
RDW: 14.4 % (ref 11.5–15.5)
WBC: 7.3 10*3/uL (ref 4.0–10.5)
nRBC: 0 % (ref 0.0–0.2)

## 2020-09-26 LAB — SARS CORONAVIRUS 2 (TAT 6-24 HRS): SARS Coronavirus 2: NEGATIVE

## 2020-09-26 SURGERY — ARTHROPLASTY, SHOULDER, TOTAL, REVERSE
Anesthesia: Regional | Site: Shoulder | Laterality: Left

## 2020-09-26 MED ORDER — MORPHINE SULFATE (PF) 2 MG/ML IV SOLN
0.5000 mg | INTRAVENOUS | Status: DC | PRN
  Administered 2020-09-27 – 2020-09-28 (×2): 1 mg via INTRAVENOUS
  Filled 2020-09-26 (×2): qty 1

## 2020-09-26 MED ORDER — METOCLOPRAMIDE HCL 5 MG/ML IJ SOLN
5.0000 mg | Freq: Three times a day (TID) | INTRAMUSCULAR | Status: DC | PRN
  Filled 2020-09-26: qty 2

## 2020-09-26 MED ORDER — TRANEXAMIC ACID-NACL 1000-0.7 MG/100ML-% IV SOLN
1000.0000 mg | Freq: Once | INTRAVENOUS | Status: AC
  Administered 2020-09-27: 1000 mg via INTRAVENOUS
  Filled 2020-09-26: qty 100

## 2020-09-26 MED ORDER — CEFAZOLIN SODIUM-DEXTROSE 2-4 GM/100ML-% IV SOLN
2.0000 g | Freq: Four times a day (QID) | INTRAVENOUS | Status: AC
  Administered 2020-09-26 – 2020-09-27 (×2): 2 g via INTRAVENOUS
  Filled 2020-09-26 (×3): qty 100

## 2020-09-26 MED ORDER — LIDOCAINE 2% (20 MG/ML) 5 ML SYRINGE
INTRAMUSCULAR | Status: AC
  Filled 2020-09-26: qty 10

## 2020-09-26 MED ORDER — BUPIVACAINE HCL (PF) 0.5 % IJ SOLN
INTRAMUSCULAR | Status: DC | PRN
  Administered 2020-09-26: 15 mL via PERINEURAL

## 2020-09-26 MED ORDER — BUPIVACAINE-EPINEPHRINE (PF) 0.25% -1:200000 IJ SOLN
INTRAMUSCULAR | Status: AC
  Filled 2020-09-26: qty 30

## 2020-09-26 MED ORDER — ONDANSETRON HCL 4 MG/2ML IJ SOLN
INTRAMUSCULAR | Status: AC
  Filled 2020-09-26: qty 2

## 2020-09-26 MED ORDER — MIDAZOLAM HCL 5 MG/5ML IJ SOLN
INTRAMUSCULAR | Status: DC | PRN
  Administered 2020-09-26: 1 mg via INTRAVENOUS

## 2020-09-26 MED ORDER — HYDROCODONE-ACETAMINOPHEN 5-325 MG PO TABS
1.0000 | ORAL_TABLET | ORAL | Status: DC | PRN
Start: 2020-09-26 — End: 2020-10-04
  Administered 2020-09-29 – 2020-10-02 (×5): 2 via ORAL
  Administered 2020-10-02: 1 via ORAL
  Administered 2020-10-02: 2 via ORAL
  Administered 2020-10-03 – 2020-10-04 (×5): 1 via ORAL
  Filled 2020-09-26: qty 2
  Filled 2020-09-26: qty 1
  Filled 2020-09-26 (×2): qty 2
  Filled 2020-09-26: qty 1
  Filled 2020-09-26 (×3): qty 2
  Filled 2020-09-26: qty 1
  Filled 2020-09-26: qty 2
  Filled 2020-09-26: qty 1

## 2020-09-26 MED ORDER — DOCUSATE SODIUM 100 MG PO CAPS
100.0000 mg | ORAL_CAPSULE | Freq: Two times a day (BID) | ORAL | Status: DC
  Administered 2020-09-26 – 2020-10-04 (×14): 100 mg via ORAL
  Filled 2020-09-26 (×16): qty 1

## 2020-09-26 MED ORDER — MIDAZOLAM HCL 2 MG/2ML IJ SOLN
INTRAMUSCULAR | Status: AC
  Filled 2020-09-26: qty 2

## 2020-09-26 MED ORDER — SODIUM CHLORIDE 0.9 % IV SOLN
INTRAVENOUS | Status: DC | PRN
  Administered 2020-09-26: 30 ug/min via INTRAVENOUS

## 2020-09-26 MED ORDER — PANTOPRAZOLE SODIUM 40 MG PO TBEC
40.0000 mg | DELAYED_RELEASE_TABLET | Freq: Every day | ORAL | Status: DC
  Administered 2020-09-26 – 2020-10-04 (×9): 40 mg via ORAL
  Filled 2020-09-26 (×8): qty 1

## 2020-09-26 MED ORDER — METHOCARBAMOL 1000 MG/10ML IJ SOLN
500.0000 mg | Freq: Four times a day (QID) | INTRAVENOUS | Status: DC | PRN
  Filled 2020-09-26: qty 5

## 2020-09-26 MED ORDER — LISINOPRIL 5 MG PO TABS
5.0000 mg | ORAL_TABLET | Freq: Every day | ORAL | Status: DC
  Administered 2020-09-26 – 2020-10-02 (×7): 5 mg via ORAL
  Filled 2020-09-26 (×7): qty 1

## 2020-09-26 MED ORDER — ENOXAPARIN SODIUM 40 MG/0.4ML IJ SOSY
40.0000 mg | PREFILLED_SYRINGE | INTRAMUSCULAR | Status: DC
  Administered 2020-09-27 – 2020-10-03 (×7): 40 mg via SUBCUTANEOUS
  Filled 2020-09-26 (×7): qty 0.4

## 2020-09-26 MED ORDER — POLYETHYLENE GLYCOL 3350 17 G PO PACK
17.0000 g | PACK | Freq: Every day | ORAL | Status: DC | PRN
  Administered 2020-09-28 – 2020-10-01 (×4): 17 g via ORAL
  Filled 2020-09-26 (×4): qty 1

## 2020-09-26 MED ORDER — CEFAZOLIN SODIUM-DEXTROSE 2-4 GM/100ML-% IV SOLN
2.0000 g | INTRAVENOUS | Status: AC
  Administered 2020-09-26: 2 g via INTRAVENOUS

## 2020-09-26 MED ORDER — PROPOFOL 10 MG/ML IV BOLUS
INTRAVENOUS | Status: DC | PRN
  Administered 2020-09-26: 150 mg via INTRAVENOUS

## 2020-09-26 MED ORDER — PHENOL 1.4 % MT LIQD
1.0000 | OROMUCOSAL | Status: DC | PRN
Start: 2020-09-26 — End: 2020-10-04

## 2020-09-26 MED ORDER — LACTATED RINGERS IV SOLN: INTRAVENOUS | Status: DC

## 2020-09-26 MED ORDER — FENTANYL CITRATE (PF) 250 MCG/5ML IJ SOLN
INTRAMUSCULAR | Status: AC
  Filled 2020-09-26: qty 5

## 2020-09-26 MED ORDER — HYDROMORPHONE HCL 1 MG/ML IJ SOLN
INTRAMUSCULAR | Status: AC
  Filled 2020-09-26: qty 1

## 2020-09-26 MED ORDER — ORAL CARE MOUTH RINSE: 15.0000 mL | Freq: Once | OROMUCOSAL | Status: DC

## 2020-09-26 MED ORDER — DEXAMETHASONE SODIUM PHOSPHATE 10 MG/ML IJ SOLN
INTRAMUSCULAR | Status: AC
  Filled 2020-09-26: qty 1

## 2020-09-26 MED ORDER — ONDANSETRON HCL 4 MG/2ML IJ SOLN: 4.0000 mg | Freq: Four times a day (QID) | INTRAMUSCULAR | Status: DC | PRN

## 2020-09-26 MED ORDER — TRANEXAMIC ACID-NACL 1000-0.7 MG/100ML-% IV SOLN
1000.0000 mg | INTRAVENOUS | Status: AC
  Administered 2020-09-26: 1000 mg via INTRAVENOUS

## 2020-09-26 MED ORDER — TRANEXAMIC ACID-NACL 1000-0.7 MG/100ML-% IV SOLN
INTRAVENOUS | Status: AC
  Filled 2020-09-26: qty 100

## 2020-09-26 MED ORDER — FENTANYL CITRATE (PF) 100 MCG/2ML IJ SOLN
INTRAMUSCULAR | Status: DC | PRN
  Administered 2020-09-26: 100 ug via INTRAVENOUS

## 2020-09-26 MED ORDER — LIDOCAINE 2% (20 MG/ML) 5 ML SYRINGE
INTRAMUSCULAR | Status: DC | PRN
  Administered 2020-09-26: 100 mg via INTRAVENOUS

## 2020-09-26 MED ORDER — SUGAMMADEX SODIUM 200 MG/2ML IV SOLN
INTRAVENOUS | Status: DC | PRN
  Administered 2020-09-26: 200 mg via INTRAVENOUS

## 2020-09-26 MED ORDER — FENTANYL CITRATE (PF) 100 MCG/2ML IJ SOLN: 50.0000 ug | Freq: Once | INTRAMUSCULAR | Status: AC

## 2020-09-26 MED ORDER — ROCURONIUM BROMIDE 10 MG/ML (PF) SYRINGE
PREFILLED_SYRINGE | INTRAVENOUS | Status: DC | PRN
  Administered 2020-09-26: 20 mg via INTRAVENOUS
  Administered 2020-09-26: 50 mg via INTRAVENOUS

## 2020-09-26 MED ORDER — FENTANYL CITRATE (PF) 100 MCG/2ML IJ SOLN
INTRAMUSCULAR | Status: AC
  Filled 2020-09-26: qty 2

## 2020-09-26 MED ORDER — VANCOMYCIN HCL 1000 MG IV SOLR
INTRAVENOUS | Status: AC
  Filled 2020-09-26: qty 1000

## 2020-09-26 MED ORDER — METOCLOPRAMIDE HCL 5 MG PO TABS: 5.0000 mg | ORAL_TABLET | Freq: Three times a day (TID) | ORAL | Status: DC | PRN

## 2020-09-26 MED ORDER — SUGAMMADEX SODIUM 500 MG/5ML IV SOLN
INTRAVENOUS | Status: AC
  Filled 2020-09-26: qty 5

## 2020-09-26 MED ORDER — SCOPOLAMINE 1 MG/3DAYS TD PT72: 1.0000 | MEDICATED_PATCH | TRANSDERMAL | Status: DC

## 2020-09-26 MED ORDER — 0.9 % SODIUM CHLORIDE (POUR BTL) OPTIME
TOPICAL | Status: DC | PRN
  Administered 2020-09-26: 1000 mL

## 2020-09-26 MED ORDER — ALBUMIN HUMAN 5 % IV SOLN: INTRAVENOUS | Status: DC | PRN

## 2020-09-26 MED ORDER — CEFAZOLIN SODIUM-DEXTROSE 2-4 GM/100ML-% IV SOLN
INTRAVENOUS | Status: AC
  Filled 2020-09-26: qty 100

## 2020-09-26 MED ORDER — FENTANYL CITRATE (PF) 100 MCG/2ML IJ SOLN
INTRAMUSCULAR | Status: AC
  Administered 2020-09-26: 50 ug via INTRAVENOUS
  Filled 2020-09-26: qty 2

## 2020-09-26 MED ORDER — ACETAMINOPHEN 325 MG PO TABS
325.0000 mg | ORAL_TABLET | Freq: Four times a day (QID) | ORAL | Status: DC | PRN
  Administered 2020-09-28 (×2): 650 mg via ORAL
  Filled 2020-09-26 (×3): qty 2

## 2020-09-26 MED ORDER — MENTHOL 3 MG MT LOZG: 1.0000 | LOZENGE | OROMUCOSAL | Status: DC | PRN

## 2020-09-26 MED ORDER — CHLORHEXIDINE GLUCONATE 0.12 % MT SOLN
OROMUCOSAL | Status: AC
  Filled 2020-09-26: qty 15

## 2020-09-26 MED ORDER — DEXAMETHASONE SODIUM PHOSPHATE 10 MG/ML IJ SOLN
INTRAMUSCULAR | Status: DC | PRN
  Administered 2020-09-26: 5 mg via INTRAVENOUS

## 2020-09-26 MED ORDER — HYDROMORPHONE HCL 1 MG/ML IJ SOLN
0.2500 mg | INTRAMUSCULAR | Status: DC | PRN
  Administered 2020-09-26: 0.25 mg via INTRAVENOUS

## 2020-09-26 MED ORDER — METHOCARBAMOL 500 MG PO TABS
500.0000 mg | ORAL_TABLET | Freq: Four times a day (QID) | ORAL | Status: DC | PRN
  Administered 2020-09-27 – 2020-10-04 (×23): 500 mg via ORAL
  Filled 2020-09-26 (×24): qty 1

## 2020-09-26 MED ORDER — PROPOFOL 10 MG/ML IV BOLUS
INTRAVENOUS | Status: AC
  Filled 2020-09-26: qty 20

## 2020-09-26 MED ORDER — METOPROLOL TARTRATE 5 MG/5ML IV SOLN
INTRAVENOUS | Status: AC
  Filled 2020-09-26: qty 5

## 2020-09-26 MED ORDER — VANCOMYCIN HCL 1 G IV SOLR
INTRAVENOUS | Status: DC | PRN
  Administered 2020-09-26: 1000 mg via TOPICAL

## 2020-09-26 MED ORDER — ACETAMINOPHEN 500 MG PO TABS
500.0000 mg | ORAL_TABLET | Freq: Four times a day (QID) | ORAL | Status: AC
  Administered 2020-09-26 – 2020-09-27 (×3): 500 mg via ORAL
  Filled 2020-09-26 (×3): qty 1

## 2020-09-26 MED ORDER — BUPIVACAINE LIPOSOME 1.3 % IJ SUSP
INTRAMUSCULAR | Status: DC | PRN
  Administered 2020-09-26: 10 mL

## 2020-09-26 MED ORDER — ACETAMINOPHEN 500 MG PO TABS: 1000.0000 mg | ORAL_TABLET | Freq: Once | ORAL | Status: DC

## 2020-09-26 MED ORDER — CHLORHEXIDINE GLUCONATE 0.12 % MT SOLN: 15.0000 mL | Freq: Once | OROMUCOSAL | Status: DC

## 2020-09-26 MED ORDER — ONDANSETRON HCL 4 MG PO TABS: 4.0000 mg | ORAL_TABLET | Freq: Four times a day (QID) | ORAL | Status: DC | PRN

## 2020-09-26 MED ORDER — HYDROCODONE-ACETAMINOPHEN 7.5-325 MG PO TABS
1.0000 | ORAL_TABLET | ORAL | Status: DC | PRN
Start: 2020-09-26 — End: 2020-10-04
  Administered 2020-09-27 – 2020-09-29 (×14): 2 via ORAL
  Administered 2020-09-30: 1 via ORAL
  Administered 2020-09-30 (×2): 2 via ORAL
  Administered 2020-10-01 (×2): 1 via ORAL
  Administered 2020-10-01: 2 via ORAL
  Administered 2020-10-02: 1 via ORAL
  Administered 2020-10-03: 2 via ORAL
  Administered 2020-10-04: 1 via ORAL
  Filled 2020-09-26 (×4): qty 2
  Filled 2020-09-26: qty 1
  Filled 2020-09-26 (×6): qty 2
  Filled 2020-09-26 (×2): qty 1
  Filled 2020-09-26 (×5): qty 2
  Filled 2020-09-26: qty 1
  Filled 2020-09-26 (×3): qty 2
  Filled 2020-09-26: qty 1
  Filled 2020-09-26: qty 2

## 2020-09-26 SURGICAL SUPPLY — 71 items
BIT DRILL 5/64X5 DISP (BIT) ×2 IMPLANT
BIT DRILL FLUTED 3.0 STRL (BIT) ×2 IMPLANT
BLADE SAG 18X100X1.27 (BLADE) ×2 IMPLANT
COVER SURGICAL LIGHT HANDLE (MISCELLANEOUS) ×2 IMPLANT
COVER WAND RF STERILE (DRAPES) ×2 IMPLANT
CUP SUT UNIV REVERS 36 NEUTRAL (Cup) ×2 IMPLANT
DRAPE IMP U-DRAPE 54X76 (DRAPES) ×4 IMPLANT
DRAPE INCISE IOBAN 66X45 STRL (DRAPES) ×2 IMPLANT
DRAPE ORTHO SPLIT 77X108 STRL (DRAPES) ×2
DRAPE SURG 17X23 STRL (DRAPES) ×2 IMPLANT
DRAPE SURG ORHT 6 SPLT 77X108 (DRAPES) ×2 IMPLANT
DRAPE U-SHAPE 47X51 STRL (DRAPES) ×2 IMPLANT
DRESSING AQUACEL AG SP 3.5X6 (GAUZE/BANDAGES/DRESSINGS) ×1 IMPLANT
DRSG AQUACEL AG ADV 3.5X10 (GAUZE/BANDAGES/DRESSINGS) ×2 IMPLANT
DRSG AQUACEL AG SP 3.5X6 (GAUZE/BANDAGES/DRESSINGS) ×2
DURAPREP 26ML APPLICATOR (WOUND CARE) ×2 IMPLANT
ELECT BLADE 4.0 EZ CLEAN MEGAD (MISCELLANEOUS) ×2
ELECT REM PT RETURN 9FT ADLT (ELECTROSURGICAL) ×2
ELECTRODE BLDE 4.0 EZ CLN MEGD (MISCELLANEOUS) ×1 IMPLANT
ELECTRODE REM PT RTRN 9FT ADLT (ELECTROSURGICAL) ×1 IMPLANT
FACESHIELD WRAPAROUND (MASK) ×2 IMPLANT
GLENOID UNI REV MOD 24 +2 LAT (Joint) ×2 IMPLANT
GLENOSPHERE 36 +4 LAT/24 (Joint) ×2 IMPLANT
GLOVE BIO SURGEON STRL SZ7.5 (GLOVE) ×4 IMPLANT
GLOVE SRG 8 PF TXTR STRL LF DI (GLOVE) ×2 IMPLANT
GLOVE SURG UNDER POLY LF SZ8 (GLOVE) ×2
GOWN STRL REUS W/ TWL LRG LVL3 (GOWN DISPOSABLE) ×1 IMPLANT
GOWN STRL REUS W/ TWL XL LVL3 (GOWN DISPOSABLE) ×2 IMPLANT
GOWN STRL REUS W/TWL LRG LVL3 (GOWN DISPOSABLE) ×1
GOWN STRL REUS W/TWL XL LVL3 (GOWN DISPOSABLE) ×2
KIT BASIN OR (CUSTOM PROCEDURE TRAY) ×2 IMPLANT
KIT TURNOVER KIT B (KITS) ×2 IMPLANT
LINER HUMERAL 36 +3MM SM (Shoulder) ×2 IMPLANT
MANIFOLD NEPTUNE II (INSTRUMENTS) ×2 IMPLANT
NEEDLE 1/2 CIR MAYO (NEEDLE) IMPLANT
NEEDLE HYPO 25GX1X1/2 BEV (NEEDLE) ×2 IMPLANT
NS IRRIG 1000ML POUR BTL (IV SOLUTION) ×2 IMPLANT
PACK SHOULDER (CUSTOM PROCEDURE TRAY) ×2 IMPLANT
PAD ARMBOARD 7.5X6 YLW CONV (MISCELLANEOUS) ×4 IMPLANT
PIN SET MODULAR GLENOID SYSTEM (PIN) ×4 IMPLANT
RESTRAINT HEAD UNIVERSAL NS (MISCELLANEOUS) ×2 IMPLANT
SCREW CENTRAL MODULAR 25 (Screw) ×2 IMPLANT
SCREW PERI LOCK 5.5X16 (Screw) ×4 IMPLANT
SCREW PERIPHERAL 5.5X20 LOCK (Screw) ×2 IMPLANT
SCREW PERIPHERAL 5.5X28 LOCK (Screw) ×2 IMPLANT
SLING ARM IMMOBILIZER LRG (SOFTGOODS) IMPLANT
SLING ARM IMMOBILIZER MED (SOFTGOODS) IMPLANT
SPONGE LAP 18X18 RF (DISPOSABLE) IMPLANT
SPONGE LAP 4X18 RFD (DISPOSABLE) ×2 IMPLANT
STAPLER VISISTAT 35W (STAPLE) ×2 IMPLANT
STEM HUMERAL UNI REVERS SZ9 (Stem) ×2 IMPLANT
STRIP CLOSURE SKIN 1/2X4 (GAUZE/BANDAGES/DRESSINGS) ×2 IMPLANT
SUCTION FRAZIER HANDLE 10FR (MISCELLANEOUS) ×1
SUCTION TUBE FRAZIER 10FR DISP (MISCELLANEOUS) ×1 IMPLANT
SUT FIBERWIRE #2 38 T-5 BLUE (SUTURE) ×4
SUT MNCRL AB 4-0 PS2 18 (SUTURE) IMPLANT
SUT VIC AB 0 CT1 27 (SUTURE) ×1
SUT VIC AB 0 CT1 27XBRD ANBCTR (SUTURE) ×1 IMPLANT
SUT VIC AB 1 CT1 27 (SUTURE) ×1
SUT VIC AB 1 CT1 27XBRD ANBCTR (SUTURE) ×1 IMPLANT
SUT VIC AB 2-0 CT1 27 (SUTURE) ×1
SUT VIC AB 2-0 CT1 TAPERPNT 27 (SUTURE) ×1 IMPLANT
SUTURE FIBERWR #2 38 T-5 BLUE (SUTURE) ×2 IMPLANT
SUTURE TAPE 1.3 40 TPR END (SUTURE) ×3 IMPLANT
SUTURETAPE 1.3 40 TPR END (SUTURE) ×6
SYR CONTROL 10ML LL (SYRINGE) ×2 IMPLANT
TOWEL GREEN STERILE (TOWEL DISPOSABLE) ×2 IMPLANT
TOWEL GREEN STERILE FF (TOWEL DISPOSABLE) ×2 IMPLANT
TOWER CARTRIDGE SMART MIX (DISPOSABLE) IMPLANT
WATER STERILE IRR 1000ML POUR (IV SOLUTION) ×2 IMPLANT
YANKAUER SUCT BULB TIP NO VENT (SUCTIONS) ×2 IMPLANT

## 2020-09-26 NOTE — Brief Op Note (Signed)
09/26/2020  6:13 PM  PATIENT:  Rosette Reveal  65 y.o. female  PRE-OPERATIVE DIAGNOSIS:  LEFT four part PROXIMAL HUMERUS FRACTURE  POST-OPERATIVE DIAGNOSIS:  LEFT four part PROXIMAL HUMERUS FRACTURE  PROCEDURE:  Procedure(s) with comments: REVERSE SHOULDER ARTHROPLASTY (Left) - 2.5 HRS  SURGEON:  Surgeon(s) and Role:    * Telisha Zawadzki, Noah Delaine, MD - Primary  PHYSICIAN ASSISTANT: Dion Saucier, PA-C   ANESTHESIA:   regional and general  EBL:  250 mL   BLOOD ADMINISTERED:none  DRAINS: none   LOCAL MEDICATIONS USED:  NONE  SPECIMEN:  No Specimen  DISPOSITION OF SPECIMEN:  N/A  COUNTS:  YES  TOURNIQUET:  * No tourniquets in log *  DICTATION: .Note written in EPIC  PLAN OF CARE: Admit to inpatient   PATIENT DISPOSITION:  PACU - hemodynamically stable.   Delay start of Pharmacological VTE agent (>24hrs) due to surgical blood loss or risk of bleeding: not applicable

## 2020-09-26 NOTE — Op Note (Signed)
Date: 09/26/2020   PRE-OPERATIVE DIAGNOSIS:  Left four proximal humerus fracture   POST-OPERATIVE DIAGNOSIS:  Same   PROCEDURE:  1. REVERSE SHOULDER ARTHROPLASTY    SURGEON:  Yolonda Kida, MD   ASSISTANT: Dion Saucier, PA-C  Assistant attestation:  PA Mcclung was present for the entire procedure.  He stated prepping drinking as well as approach to the shoulder and fixation of reverse arthroplasty and tuberosity repair.   ANESTHESIA:   General with a block   ESTIMATED BLOOD LOSS: See anesthesia record   PREOPERATIVE INDICATIONS: Abigail Luna  is a right-hand-dominant 65 year old female who sustained a left comminuted, four-part proximal humerus fracture following a fall.    She actually also sustained a right proximal humerus fracture that is amenable to conservative treatment with sling and nonweightbearing.  She also has a left distal radius fracture.  Due to the comminution and displaced nature of the fracture and head splitting components we discussed operative management.  We discussed moving forward with reverse shoulder arthroplasty for his injury to allow earlier weight bearing on a walker and/or cane as needed and lower risk of AVN, non union or progression of shoulder arthritis following the fracture. Thus we elected to proceed with reverse shoulder arthroplasty for the plan.  The risks benefits and alternatives were discussed with the patient preoperatively including but not limited to the risks of infection, bleeding, nerve injury, cardiopulmonary complications, the need for revision surgery, dislocation, brachial plexus palsy, incomplete relief of pain, among others, and the patient was willing to proceed. The patient did provided informed consent.   OPERATIVE IMPLANTS:  Arthrex reverse arthroplasty system Size 9 stem 36+4 mm glenosphere 24+2 mm baseplate Standard humeral tray +3 8 mm polyethylene liner on the humeral stem.   OPERATIVE FINDINGS:   Head splitting  fracture with greater tuberosity involvement and the remaining fracture fragments involving the intertubercular groove and lesser tuberosity.  No apparent preinjury arthritis or rotator cuff pathology.  Bone quality is poor.   OPERATIVE PROCEDURE: The patient was brought to the operating room and placed in the supine position. General anesthesia was administered. IV antibiotics were given. Time out was performed. The upper extremity was prepped and draped in usual sterile fashion. The patient was in a beachchair position. Deltopectoral approach was carried out. After dissection through skin and subcutaneous fat, the cephalic vein was identified with the deltopectoral interval.  This was mobilized and taken medially.   The fracture was identified and working through the fracture in the biceps groove the lesser and greater tuberosities were freed up and tagged with #2 Fiber tape sutures.  The humeral head was fragmented and removed from the wound.     The long head of the biceps was tenotomized.   I then performed circumferential releases of the humerus.  I then moved to sizing the humerus.  There was mild calcar bone loss and this was used to reference the height of the stem, as was the upper border of the pectoralis major tendon.  The canal was reamed and found to fit best with a 9 mm fracture stem.   We next turned to the glenoid.  Deep retractors were placed, and I resected the labrum as well as the residual long head of biceps, and then placed a guidepin into the center position on the glenoid, with slight inferior declination. I then reamed over the guidepin, and this created a small metaphyseal cancellus blush inferiorly, removing just the cartilage to the subchondral bone superiorly. The base plate  was selected and impacted place, and then I secured it centrally with a nonlocking screw, and I had excellent purchase both inferiorly and superiorly. I placed a short locking screws on anterior aspect,   And posterior aspect.   I then turned my attention to the glenosphere, and impacted this into place.   The glenoid sphere was completely seated, and had engagement of the [facility] taper. I then turned my attention back to the humerus.    The 9 mm fracture stem was seated to the appropriate height to allow approximate 5.6 cm from the top of the Pectoralis major tendon to the top of the glenosphere.  The stem was placed in 30 degrees of retroversion.   Once the stem was impacted into place we trialed poly liners.  Using the standard humeral metaglen,  The shoulder had excellent motion, and was stable.  The final poly was impacted and again showed good motion and stability.  Next, I irrigated the wounds copiously.    The greater tuberosity was brought back to the humeral stem suture holes and secured with bone graft from the humeral head as augment.    Likewise, we secured the subscapularis without the lesser trochanter to the humeral tray via the suture cup suture options.   I then irrigated the shoulder copiously once more, and placed 1 g of vancomycin powder into the wound, repaired the deltopectoral interval with Vicryl followed by subcutaneous 2-0 Vicryl, and then staples for the skin. The patient was awakened and returned back in stable and satisfactory condition. There no complications and he tolerated the procedure well.  All counts were correct.  The patient awakened from general anesthesia with no complications and transferred to PACU in stable condition.   Postoperative Plan: Brelyn Woehl will be admitted to my service as an inpatient.  She has significant disability with bilateral proximal humerus fractures, she lives alone and is independent at baseline, and will need maximum help.  She will be admitted for pain control as well as occupational and physical therapy to help with disposition.  She will be placed back into her sling for the right arm for the proximal humerus fracture, and in the sling  for the left shoulder for today's surgical procedure.  She will be appropriate for only activities of daily living and 2 pounds lifting with both right and left upper extremities.  Anticipate she will either need to be discharged home with home health and 24-hour assistance versus skilled nursing facility.  She will be on Lovenox while inpatient and discharged home on aspirin.

## 2020-09-26 NOTE — Anesthesia Preprocedure Evaluation (Addendum)
Anesthesia Evaluation  Patient identified by MRN, date of birth, ID band Patient awake    History of Anesthesia Complications (+) PONV and history of anesthetic complications  Airway Mallampati: III  TM Distance: >3 FB Neck ROM: Full    Dental no notable dental hx.    Pulmonary neg pulmonary ROS,    Pulmonary exam normal breath sounds clear to auscultation       Cardiovascular Exercise Tolerance: Good hypertension, Normal cardiovascular exam Rhythm:Regular Rate:Normal     Neuro/Psych PSYCHIATRIC DISORDERS Anxiety negative neurological ROS     GI/Hepatic Neg liver ROS, GERD  ,  Endo/Other  diabetesMorbid obesity  Renal/GU negative Renal ROS  negative genitourinary   Musculoskeletal negative musculoskeletal ROS (+)   Abdominal   Peds negative pediatric ROS (+)  Hematology negative hematology ROS (+)   Anesthesia Other Findings   Reproductive/Obstetrics negative OB ROS                            Anesthesia Physical Anesthesia Plan  ASA: II  Anesthesia Plan: General and Regional   Post-op Pain Management: GA combined w/ Regional for post-op pain   Induction:   PONV Risk Score and Plan: 4 or greater and Midazolam, Scopolamine patch - Pre-op, Ondansetron and Dexamethasone  Airway Management Planned: Oral ETT  Additional Equipment: None  Intra-op Plan:   Post-operative Plan: Extubation in OR  Informed Consent: I have reviewed the patients History and Physical, chart, labs and discussed the procedure including the risks, benefits and alternatives for the proposed anesthesia with the patient or authorized representative who has indicated his/her understanding and acceptance.       Plan Discussed with: Anesthesiologist and CRNA  Anesthesia Plan Comments:         Anesthesia Quick Evaluation

## 2020-09-26 NOTE — H&P (Signed)
ORTHOPAEDIC H and P  REQUESTING PHYSICIAN: Yolonda Kida, MD  PCP:  Paulina Fusi, MD  Chief Complaint: Bilateral shoulder trauma  HPI: Abigail Luna is a 65 y.o. female who complains of bilateral shoulder pain as well as left wrist pain.  She was involved in an unfortunate ground-level fall last week.  This resulted in fractures to her right and left proximal humerus.  She also had a distal radius fracture on the left.  The left wrist and right shoulder are indicated for conservative treatment.  However there is a head splitting component on the left side that she is indicated for arthroplasty with a reverse arthroplasty.  She is here today for that surgery as well as admission and postoperative care.  No new complaints today.  We saw her in the office last week to discuss planning for the surgery.  She does have moderate pain and swelling consistent with injuries.  Denies numbness or tingling.  Past Medical History:  Diagnosis Date  . Anxiety   . Back pain   . Complication of anesthesia   . GERD (gastroesophageal reflux disease)   . Hypertension   . PONV (postoperative nausea and vomiting)   . Pre-diabetes   . Spinal stenosis    Past Surgical History:  Procedure Laterality Date  . COLONOSCOPY     Social History   Socioeconomic History  . Marital status: Widowed    Spouse name: Not on file  . Number of children: Not on file  . Years of education: Not on file  . Highest education level: Not on file  Occupational History  . Not on file  Tobacco Use  . Smoking status: Never Smoker  . Smokeless tobacco: Never Used  Vaping Use  . Vaping Use: Never used  Substance and Sexual Activity  . Alcohol use: Not Currently  . Drug use: Not Currently  . Sexual activity: Not on file  Other Topics Concern  . Not on file  Social History Narrative  . Not on file   Social Determinants of Health   Financial Resource Strain: Not on file  Food Insecurity: Not on file   Transportation Needs: Not on file  Physical Activity: Not on file  Stress: Not on file  Social Connections: Not on file   History reviewed. No pertinent family history. Allergies  Allergen Reactions  . Other     Tolerates Prednisone in small doses, in higher doses cause palpitations   Prior to Admission medications   Medication Sig Start Date End Date Taking? Authorizing Provider  acetaminophen (TYLENOL) 500 MG tablet Take 1,000 mg by mouth every 6 (six) hours as needed (pain).   Yes [provider]  ibuprofen (ADVIL) 200 MG tablet Take 400-800 mg by mouth every 8 (eight) hours as needed (pain.).   Yes [provider]  ketotifen (ZADITOR) 0.025 % ophthalmic solution Place 1 drop into both eyes 2 (two) times daily as needed (allergy/eye irritation.).   Yes [provider]  lisinopril (ZESTRIL) 5 MG tablet Take 5 mg by mouth at bedtime. 07/23/20  Yes [provider]  methocarbamol (ROBAXIN) 500 MG tablet Take 500 mg by mouth in the morning, at noon, and at bedtime.   Yes [provider]  omeprazole (PRILOSEC) 20 MG capsule Take 20 mg by mouth daily as needed (acid reflux/indigestion). 07/23/20  Yes [provider]  oxyCODONE (OXY IR/ROXICODONE) 5 MG immediate release tablet Take 5 mg by mouth every 4 (four) hours as needed (pain).  Yes [provider]  polyethylene glycol (MIRALAX / GLYCOLAX) 17 g packet Take 17 g by mouth daily as needed (constipation).   Yes [provider]  morphine (MSIR) 15 MG tablet Take 0.5 tablets (7.5 mg total) by mouth every 4 (four) hours as needed for severe pain. Patient not taking: Reported on 09/25/2020 09/18/20   Melene Plan, DO  NYSTATIN powder Apply 1 application topically 3 (three) times daily as needed (skin irritation/rash). 08/12/20   [provider]   No results found.  Positive ROS: All other systems have been reviewed and were otherwise negative with the exception of those  mentioned in the HPI and as above.  Physical Exam: General: Alert, no acute distress Cardiovascular: No pedal edema Respiratory: No cyanosis, no use of accessory musculature GI: No organomegaly, abdomen is soft and non-tender Skin: No lesions in the area of chief complaint Neurologic: Sensation intact distally Psychiatric: Patient is competent for consent with normal mood and affect Lymphatic: No axillary or cervical lymphadenopathy  MUSCULOSKELETAL:  Left upper extremity:  She has some swelling throughout the entire upper extremity.  Distally she is neurovascularly intact and has tenderness noted along the wrist.  Assessment: 1.  Right nondisplaced proximal humerus fracture 2.  Left nondisplaced distal radius fracture 3.  Left four-part proximal humerus fracture  Plan: -[name] and I again reviewed her injury as well as treatment recommendations.  We will continue with nonoperative management of the right proximal humerus and left distal radius.  She will be nonweightbearing to the right arm and left wrist.  -For this displaced and head split involved left proximal humerus fracture, my recommendation is for a reverse arthroplasty.  We have previously discussed that in the office.  She is in agreement with that plan.  -We did specifically discussed the risk of bleeding, infection, fracture, dislocation, need for further surgery, persistent pain and stiffness, and the risk of anesthesia.  She has provided informed consent.  -We will admit postoperatively to the orthopedics floor for postoperative pain control as well as postoperative occupational therapy to get her organized for safe disposition postoperatively either home with home health or probably a skilled nursing facility.    Yolonda Kida, MD Cell [phone]    09/26/2020 3:40 PM

## 2020-09-26 NOTE — Anesthesia Postprocedure Evaluation (Signed)
Anesthesia Post Note  Patient: Abigail Luna  Procedure(s) Performed: REVERSE SHOULDER ARTHROPLASTY (Left Shoulder)     Patient location during evaluation: PACU Anesthesia Type: Regional and General Level of consciousness: awake Pain management: pain level controlled Vital Signs Assessment: post-procedure vital signs reviewed and stable Respiratory status: spontaneous breathing Cardiovascular status: stable Postop Assessment: no apparent nausea or vomiting Anesthetic complications: no   No complications documented.  Last Vitals:  Vitals:   09/26/20 1915 09/26/20 1945  BP: (!) 138/54 (!) 138/50  Pulse: 97 97  Resp: 16 14  Temp: 36.9 C   SpO2: 100% 100%    Last Pain:  Vitals:   09/26/20 1915  TempSrc:   PainSc: 3                  [name]

## 2020-09-26 NOTE — Anesthesia Procedure Notes (Signed)
Procedure Name: Intubation Date/Time: 09/26/2020 4:48 PM Performed by: Gwenyth Allegra, CRNA Pre-anesthesia Checklist: Patient identified, Emergency Drugs available, Suction available and Patient being monitored Patient Re-evaluated:Patient Re-evaluated prior to induction Oxygen Delivery Method: Circle system utilized Preoxygenation: Pre-oxygenation with 100% oxygen Induction Type: IV induction Ventilation: Mask ventilation without difficulty Laryngoscope Size: Miller and 2 Grade View: Grade I Tube type: Oral Tube size: 7.0 mm Number of attempts: 1 Airway Equipment and Method: Stylet Placement Confirmation: ETT inserted through vocal cords under direct vision Secured at: 21 cm Tube secured with: Tape Dental Injury: Teeth and Oropharynx as per pre-operative assessment  Comments: Performed by Williemae Natter

## 2020-09-26 NOTE — Anesthesia Procedure Notes (Addendum)
Anesthesia Regional Block: Interscalene brachial plexus block   Pre-Anesthetic Checklist: ,, timeout performed, Correct Patient, Correct Site, Correct Laterality, Correct Procedure, Correct Position, site marked, Risks and benefits discussed,  Surgical consent,  Pre-op evaluation,  At surgeon's request and post-op pain management  Laterality: Left  Prep: chloraprep       Needles:  Injection technique: Single-shot  Needle Type: Echogenic Stimulator Needle     Needle Length: 5cm  Needle Gauge: 22     Additional Needles:   Procedures:,,,, ultrasound used (permanent image in chart),,,,  Narrative:  Start time: 09/26/2020 2:10 PM End time: 09/26/2020 2:15 PM Injection made incrementally with aspirations every 5 mL.  Performed by: Personally  Anesthesiologist: Mellody Dance, MD  Additional Notes: Functioning IV was confirmed and monitors applied. Sterile prep and drape,hand hygiene and sterile gloves were used.Ultrasound guidance: relevant anatomy identified, needle position confirmed, local anesthetic spread visualized around nerve(s)., vascular puncture avoided.  Image printed for medical record.  Negative aspiration and negative test dose prior to incremental administration of local anesthetic. The patient tolerated the procedure well.

## 2020-09-27 ENCOUNTER — Other Ambulatory Visit: Payer: Self-pay

## 2020-09-27 ENCOUNTER — Encounter (HOSPITAL_COMMUNITY): Payer: Self-pay | Admitting: Orthopedic Surgery

## 2020-09-27 LAB — BASIC METABOLIC PANEL
Anion gap: 5 (ref 5–15)
BUN: 18 mg/dL (ref 8–23)
CO2: 20 mmol/L — ABNORMAL LOW (ref 22–32)
Calcium: 9.5 mg/dL (ref 8.9–10.3)
Chloride: 106 mmol/L (ref 98–111)
Creatinine, Ser: 0.66 mg/dL (ref 0.44–1.00)
GFR, Estimated: >60 mL/min (ref >60)
Glucose, Bld: 190 mg/dL — ABNORMAL HIGH (ref 70–99)
Potassium: 4.7 mmol/L (ref 3.5–5.1)
Sodium: 131 mmol/L — ABNORMAL LOW (ref 135–145)

## 2020-09-27 LAB — CBC
HCT: 25.7 % — ABNORMAL LOW (ref 36.0–46.0)
Hemoglobin: 9.1 g/dL — ABNORMAL LOW (ref 12.0–15.0)
MCH: 34.3 pg — ABNORMAL HIGH (ref 26.0–34.0)
MCHC: 35.4 g/dL (ref 30.0–36.0)
MCV: 97 fL (ref 80.0–100.0)
Platelets: 83 10*3/uL — ABNORMAL LOW (ref 150–400)
RBC: 2.65 MIL/uL — ABNORMAL LOW (ref 3.87–5.11)
RDW: 14 % (ref 11.5–15.5)
WBC: 5.4 10*3/uL (ref 4.0–10.5)
nRBC: 0 % (ref 0.0–0.2)

## 2020-09-27 NOTE — Progress Notes (Signed)
Occupational Therapy Evaluation Patient Details Name: Abigail Luna MRN: [id number] DOB: [birth year] Today's Date: 09/27/2020    History of Present Illness Pt is 65 yo female who sustained ground level fall last week and found to have bil proximal humerus fx and L distal radius fx.  She was admitted on 09/26/20 for L reverse total shoulder.  L wrist and R humerus are being managed with immobilization.  Pt with medical hx including back pain, anxiety, spinal stenosis.   Clinical Impression   Abigail Luna was evaluated s/p the above fall & reverse TSA. Abigail Luna is eager for ambulation and pleasant for therapy. PTA she was indep in all ADL/IADLs and lives alone, however since her fall she has been staying with friends who have been providing assistance with ADLs. Pt was mod A for bed mobility, and min guard for all transfers and ambulation given verbal cues for safety awareness. Pt requires total A for bathing and dressing, and mod A for eating. Pt would benefit from long handled utensils and her food cut up for meals. Pt required heavy verbal encouragement to participate in all bilat UE exercises, and reported having anxiety with any movement of her arms due to pain. Pt educated on all precautions and exercises, handouts left in room. Pt with increased swelling in bilat hands, pt educated on importance of good BUE positioning and AROM and verbalized understanding. Pt would benefit from continued skilled OT services acutely to progress function in directing her care and adherence to HEP. Recommend d/c to home (friends house most likely) with home health and assistance with all ADLs. If pt is unable to receive this level of care from friends, recommend d/c to SNF.     Follow Up Recommendations  Supervision/Assistance - 24 hour;Home health OT    Equipment Recommendations  Other (comment);Tub/shower bench;Tub/shower seat (DME needs to be confirmed once d/c plan is made; OT generally recommending tub bench or shower  seat depending on d/c placement)    Recommendations for Other Services       Precautions / Restrictions Precautions Precautions: Shoulder Type of Shoulder Precautions: reverse total shoulder Shoulder Interventions: Shoulder sling/immobilizer;Off for dressing/bathing/exercises Precaution Booklet Issued: Yes (comment) Precaution Comments: R shoulder: immobilization, NWB, shoulder sling.       L shoulder: reverse TSA, NWB, shoulder sling.  L wrist: cock up splint NWB.       Per MD: "appropriate for only activities of daily living and 2 pounds lifting with both right and left upper extremities."     AROM of bilat hands, R wrist, and bilat elbows; L shoulder pendulums Required Braces or Orthoses: Sling;Splint/Cast Splint/Cast: L wrist cock up; bilat slings Restrictions Weight Bearing Restrictions: Yes RUE Weight Bearing: Non weight bearing LUE Weight Bearing: Non weight bearing      Mobility Bed Mobility Overal bed mobility: Needs Assistance Bed Mobility: Supine to Sit     Supine to sit: Mod assist;HOB elevated     General bed mobility comments: Increased assist to scoot to EOB    Transfers Overall transfer level: Needs assistance Equipment used: None Transfers: Sit to/from Stand Sit to Stand: Min guard         General transfer comment: performed x 3 during session; use of momentum; min guard for safety    Balance Overall balance assessment: Needs assistance Sitting-balance support: No upper extremity supported Sitting balance-Leahy Scale: Good     Standing balance support: No upper extremity supported Standing balance-Leahy Scale: Good  ADL either performed or assessed with clinical judgement   ADL Overall ADL's : Needs assistance/impaired Eating/Feeding: Sitting;Moderate assistance Eating/Feeding Details (indicate cue type and reason): requires set up for all packages and cutting, required long utencil to prevent shoulder ROM Grooming:  Wash/dry hands;Wash/dry face;Oral care;Applying deodorant;Brushing hair;Maximal assistance;Sitting   Upper Body Bathing: Total assistance;Sitting   Lower Body Bathing: Total assistance;Sit to/from stand   Upper Body Dressing : Total assistance;Sitting   Lower Body Dressing: Total assistance;Sit to/from stand   Toilet Transfer: Min guard;Comfort height toilet;Ambulation   Toileting- Clothing Manipulation and Hygiene: Total assistance;Sit to/from stand       Functional mobility during ADLs: Min guard General ADL Comments: Pt requiring total A for all bathing and dressing ADLs, pt able to sit<>stand to assist wtih easier cleaning     Vision Baseline Vision/History: Wears glasses Wears Glasses: Reading only              Pertinent Vitals/Pain Pain Assessment: Faces Faces Pain Scale: Hurts little more Pain Location: soreness tail bone Pain Descriptors / Indicators: Burning Pain Intervention(s): Limited activity within patient's tolerance;Monitored during session     Hand Dominance Right   Extremity/Trunk Assessment Upper Extremity Assessment Upper Extremity Assessment: RUE deficits/detail;LUE deficits/detail RUE Deficits / Details: R humerus fx RUE:  (Pt with incrased swelling in distal RUE, limited ROM of all elbow, wrist and hand joints due to swelling) RUE Coordination: decreased fine motor;decreased gross motor LUE Deficits / Details: L TRS, L didstal radius fx LUE:  (limited ROM in L elbow flex/ext due to pain) LUE Sensation: WNL LUE Coordination: decreased fine motor;decreased gross motor   Lower Extremity Assessment Lower Extremity Assessment: Defer to PT evaluation   Cervical / Trunk Assessment Cervical / Trunk Assessment: Normal;Other exceptions (incrased body habitus)   Communication Communication Communication: No difficulties   Cognition Arousal/Alertness: Awake/alert Behavior During Therapy: WFL for tasks assessed/performed Overall Cognitive Status:  Within Functional Limits for tasks assessed               General Comments  all dressings clean and dry, bilat slings, L wrist cock up, noted with incrased swelling in bilat hands; pt with complaints of contant urination with some concern of UTI            Home Living Family/patient expects to be discharged to:: Unsure Living Arrangements: Alone Available Help at Discharge: Friend(s) (pt statying with friends since fall) Type of Home: House Home Access: Stairs to enter [employer] of Steps: 2   Home Layout: One level     Bathroom Shower/Tub: Tub/shower unit;Walk-in shower   Bathroom Toilet: Handicapped height Bathroom Accessibility: Yes       Additional Comments: walking stick; information above on friends house      Prior Functioning/Environment Level of Independence: Needs assistance  Gait / Transfers Assistance Needed: Could ambulate in community; used walking stick outside ADL's / Homemaking Assistance Needed: Completely independent with ADLs, IADLs, driving, shopping   Comments: Spinal stenosis at baseline; has been staying with friends since fall and had assist with ADLs        OT Problem List: Decreased strength;Decreased range of motion;Decreased activity tolerance;Impaired balance (sitting and/or standing);Decreased safety awareness;Decreased knowledge of use of DME or AE;Decreased knowledge of precautions;Pain;Impaired UE functional use      OT Treatment/Interventions: Self-care/ADL training;Therapeutic exercise;Modalities;Splinting;Balance training;Patient/family education    OT Goals(Current goals can be found in the care plan section) Acute Rehab OT Goals Patient Stated Goal: return to independence OT Goal Formulation: With  patient Time For Goal Achievement: 10/11/20 Potential to Achieve Goals: Fair ADL Goals Pt Will Perform Eating: with set-up;with adaptive utensils;sitting Pt Will Transfer to Toilet:  Independently;ambulating Additional ADL Goal #1: Pt will indep complete HEP for L shoulder  OT Frequency: Min 2X/week        Co-evaluation PT/OT/SLP Co-Evaluation/Treatment: Yes Reason for Co-Treatment: Complexity of the patient's impairments (multi-system involvement);For patient/therapist safety;To address functional/ADL transfers PT goals addressed during session: Mobility/safety with mobility OT goals addressed during session: ADL's and self-care;Proper use of Adaptive equipment and DME;Strengthening/ROM      AM-PAC OT "6 Clicks" Daily Activity     Outcome Measure Help from another person eating meals?: A Lot Help from another person taking care of personal grooming?: A Lot Help from another person toileting, which includes using toliet, bedpan, or urinal?: A Lot Help from another person bathing (including washing, rinsing, drying)?: Total Help from another person to put on and taking off regular upper body clothing?: Total Help from another person to put on and taking off regular lower body clothing?: Total 6 Click Score: 9   End of Session Equipment Utilized During Treatment: Gait belt Nurse Communication: Mobility status;Weight bearing status;Precautions;Other (comment) (pt conern of UTI)  Activity Tolerance: Patient tolerated treatment well Patient left: in chair;with call bell/phone within reach;with chair alarm set  OT Visit Diagnosis: Muscle weakness (generalized) (M62.81);History of falling (Z91.81);Pain Pain - Right/Left:  (back) Pain - part of body:  (back)                Time: 1740-8144 OT Time Calculation (min): 72 min Charges:  OT General Charges $OT Visit: 1 Visit OT Evaluation $OT Eval Moderate Complexity: 1 Mod OT Treatments $Self Care/Home Management : 23-37 mins $Therapeutic Exercise: 53-67 mins   Algernon Mundie A Kenetha Cozza 09/27/2020, 2:41 PM

## 2020-09-27 NOTE — Progress Notes (Signed)
Subjective: Abigail Luna is a 65 year old female 1 day status post left reverse total shoulder arthroplasty for fracture.  Patient reports pain as mild to moderate.  Reports more pain in the low back then the shoulders at the moment. Also reports wrist pain. Reports block is almost fully worn off. She reports she did okay with walking with therapy. Denies fever or chills. Denies calf pain.   Objective:   VITALS:   Vitals:   09/26/20 1915 09/26/20 1945 09/26/20 2010 09/27/20 0754  BP: (!) 138/54 (!) 138/50 (!) 139/54 (!) 158/62  Pulse: 97 97 (!) 107 (!) 103  Resp: 16 14 16 17   Temp: 98.5 F (36.9 C)  98.1 F (36.7 C) 98.4 F (36.9 C)  TempSrc:    Oral  SpO2: 100% 100% 98% 96%  Weight:      Height:       Constitutional: General Appearance: healthy-appearing, well-nourished, and well-developed. Level of Distress: no acute distress. Eyes: Lens (normal) clear: both eyes. Head: Head: normocephalic and atraumatic. Lungs: Respiratory effort: no dyspnea. Skin: Inspection and palpation: no rash, lesions Neurologic: Cranial Nerves: grossly intact. Sensation: grossly intact. Psychiatric: Insight: good judgement and insight. Mental Status: normal mood and affect and active and alert. Orientation: to time, place, and person.   Right Upper Extremity:  NV intact. Elbow ROM 10-130. Able to move all digits and wrist. Edema of the hand.    Left Upper Extremity:   INSPECTION & PALPATION: Aquacel dressing present to the left anterior shoulder without drainage. Edema of the hands.    SENSORY: sensation is intact to light touch in:  superficial radial nerve distribution (dorsal first web space) median nerve distribution (tip of index finger)   ulnar nerve distribution (tip of small finger)    Axillary nerve distribution (lateral shoulder)   ROM: painless passive range of motion of the elbow . TTP wrist with palpation. Minimal pain with slight passive FF.    MOTOR:  + motor posterior  interosseous nerve (thumb IP extension) + anterior interosseous nerve (thumb IP flexion, index finger DIP flexion) + radial nerve (wrist extension) + median nerve (palpable firing thenar mass) + ulnar nerve (palpable firing of first dorsal interosseous muscle)    VASCULAR: 2+ radial pulse, brisk capillary refill < 2 sec, fingers warm and well-perfused      Lab Results  Component Value Date   WBC 5.4 09/27/2020   HGB 9.1 (L) 09/27/2020   HCT 25.7 (L) 09/27/2020   MCV 97.0 09/27/2020   PLT 83 (L) 09/27/2020   BMET    Component Value Date/Time   NA 131 (L) 09/27/2020 0253   K 4.7 09/27/2020 0253   CL 106 09/27/2020 0253   CO2 20 (L) 09/27/2020 0253   GLUCOSE 190 (H) 09/27/2020 0253   BUN 18 09/27/2020 0253   CREATININE 0.66 09/27/2020 0253   CALCIUM 9.5 09/27/2020 0253   GFRNONAA >60 09/27/2020 0253     Assessment/Plan: 1 Day Post-Op   Active Problems:   Closed fracture of left proximal humerus   Advance diet Up with therapy   Weightbearing Status:  Left Upper Extremity: Okay to begin gentle passive forward flexion ROM of the left shoulder up to 90 degrees as tolerated. Okay to move elbow and digits passive/active. No lifting more than 2 pounds with the LUE. Sling should be on except for exercises, showering, ADLs. Sling can be relaxed when awake with pillows below under elbows.  Maintain left removable wrist splint except for hygiene, ADLs.  Right Upper Extremity: Okay to move elbow and digits. No lifting more than 2 pounds with the RUE. Sling should be on except for exercises, showering, ADLs.    DVT Prophylaxis: Lovenox inpatient, Aspirin upon discharge  She has significant disability with bilateral proximal humerus fractures, she lives alone and is independent at baseline, and will need maximum help.   Anticipate she will either need to be discharged home with home health and 24-hour assistance versus skilled nursing facility.  She will be on Lovenox while  inpatient and discharged home on aspirin  Will await further recommendations when it comes to discharge but anticipate a few more days inpatient.   [name] 09/27/2020, 2:42 PM  Dion Saucier PA-C  Physician Assistant with Dr. Rebekah [place]

## 2020-09-27 NOTE — Evaluation (Signed)
Physical Therapy Evaluation Patient Details Name: Abigail Luna MRN: [id number] DOB: 1955-05-18 Today's Date: 09/27/2020   History of Present Illness  Pt is 65 yo female who sustained ground level fall last week and found to have bil proximal humerus fx and L distal radius fx.  She was admitted on 09/26/20 for L reverse total shoulder.  L wrist and R humerus are being managed with immobilization.  Pt with medical hx including back pain, anxiety, spinal stenosis.  Clinical Impression  Pt admitted with above diagnosis. Pt fell about a week ago and since that time has been staying with friends who have been assisting with ADLs.  Prior to fall , pt was completely independent.  Today, pt required mod A for bed mobility (has been sleeping in recliner since fall) and min guard for walking and sit to stands.  Pt was steady with ambulation requiring min cues for safety.  If pt continues to have 24 hr support could return home.  She will likely not have post acute PT needs, defer to OT for d/c recommendations for OT.  Pt currently with functional limitations due to the deficits listed below (see PT Problem List). Pt will benefit from skilled PT to increase their independence and safety with mobility to allow discharge to the venue listed below.       Follow Up Recommendations No PT follow up;Supervision/Assistance - 24 hour (Will likely need OT at d/c -defer to OT)    Equipment Recommendations  None recommended by PT    Recommendations for Other Services       Precautions / Restrictions Precautions Precautions: Shoulder Type of Shoulder Precautions: reverse total shoulder Shoulder Interventions: Shoulder sling/immobilizer;Off for dressing/bathing/exercises Precaution Comments: R shoulder: immobilization, NWB, shoulder sling.  L shoulder: reverse TSA, NWB, shoulder sling.  L wrist: cock up splint NWB Restrictions Weight Bearing Restrictions: Yes RUE Weight Bearing: Non weight bearing LUE Weight  Bearing: Non weight bearing      Mobility  Bed Mobility Overal bed mobility: Needs Assistance Bed Mobility: Supine to Sit     Supine to sit: Mod assist;HOB elevated     General bed mobility comments: Increased assist to scoot to EOB    Transfers Overall transfer level: Needs assistance   Transfers: Sit to/from Stand Sit to Stand: Min guard         General transfer comment: performed x 3 during session; use of momentum; min guard for safety  Ambulation/Gait Ambulation/Gait assistance: Min guard Gait Distance (Feet): 180 Feet Assistive device: None Gait Pattern/deviations: Wide base of support;Decreased stride length Gait velocity: decreased   General Gait Details: slow cautious gait speed but stable without use of AD; cues around doorways to take care not to bump arms  Stairs            Wheelchair Mobility    Modified Rankin (Stroke Patients Only)       Balance Overall balance assessment: Needs assistance Sitting-balance support: No upper extremity supported Sitting balance-Leahy Scale: Good     Standing balance support: No upper extremity supported Standing balance-Leahy Scale: Good                               Pertinent Vitals/Pain Pain Assessment: Faces Faces Pain Scale: Hurts little more Pain Location: soreness tail bone Pain Descriptors / Indicators: Burning Pain Intervention(s): Limited activity within patient's tolerance;Monitored during session;Repositioned;Premedicated before session    Home Living Family/patient expects to be discharged to::  Unsure Living Arrangements: Alone Available Help at Discharge: Friend(s) (pt was staying with friends since fall) Type of Home: House Home Access: Stairs to enter Entrance Stairs-Rails:  (can't use rail b/c of fx) Entrance Stairs-Number of Steps: 2 Home Layout: One level Home Equipment: Other (comment) Additional Comments: walking stick; information above on friends house     Prior Function Level of Independence: Needs assistance   Gait / Transfers Assistance Needed: Could ambulate in community; used walking stick outside  ADL's / Homemaking Assistance Needed: Completely independent with ADLs, IADLs, driving, shopping  Comments: Spinal stenosis at baseline; has been staying with friends since fall and had assist with ADLs     Hand Dominance        Extremity/Trunk Assessment   Upper Extremity Assessment Upper Extremity Assessment: Defer to OT evaluation    Lower Extremity Assessment Lower Extremity Assessment: Overall WFL for tasks assessed    Cervical / Trunk Assessment Cervical / Trunk Assessment: Normal  Communication   Communication: No difficulties  Cognition Arousal/Alertness: Awake/alert Behavior During Therapy: WFL for tasks assessed/performed Overall Cognitive Status: Within Functional Limits for tasks assessed                                        General Comments General comments (skin integrity, edema, etc.): Pt with DOE of 2/4 but O2 sats 95% on RA; HR up to 122 bpm with activity    Exercises     Assessment/Plan    PT Assessment Patient needs continued PT services  PT Problem List Decreased strength;Decreased mobility;Decreased safety awareness;Decreased knowledge of precautions;Decreased activity tolerance;Decreased balance;Decreased knowledge of use of DME;Pain       PT Treatment Interventions DME instruction;Therapeutic activities;Gait training;Therapeutic exercise;Patient/family education;Stair training;Balance training;Functional mobility training    PT Goals (Current goals can be found in the Care Plan section)  Acute Rehab PT Goals Patient Stated Goal: return to independence PT Goal Formulation: With patient Time For Goal Achievement: 10/11/20 Potential to Achieve Goals: Good    Frequency Min 3X/week   Barriers to discharge        Co-evaluation PT/OT/SLP Co-Evaluation/Treatment:  Yes Reason for Co-Treatment: Complexity of the patient's impairments (multi-system involvement);Other (comment) (bil UE fx, obesity) PT goals addressed during session: Mobility/safety with mobility OT goals addressed during session: ADL's and self-care;Strengthening/ROM       AM-PAC PT "6 Clicks" Mobility  Outcome Measure Help needed turning from your back to your side while in a flat bed without using bedrails?: A Little Help needed moving from lying on your back to sitting on the side of a flat bed without using bedrails?: A Little Help needed moving to and from a bed to a chair (including a wheelchair)?: A Little Help needed standing up from a chair using your arms (e.g., wheelchair or bedside chair)?: A Little Help needed to walk in hospital room?: A Little Help needed climbing 3-5 steps with a railing? : A Little 6 Click Score: 18    End of Session Equipment Utilized During Treatment: Other (comment) (bil shoulder slings; L cock up wrist splint) Activity Tolerance: Patient tolerated treatment well Patient left: in chair;with call bell/phone within reach;with chair alarm set (Working with OT) Nurse Communication: Mobility status PT Visit Diagnosis: Unsteadiness on feet (R26.81)    Time: 1030-1058 PT Time Calculation (min) (ACUTE ONLY): 28 min   Charges:   PT Evaluation $PT Eval Moderate Complexity: 1 Mod  Abigail Luna, PT Acute Rehab Services Pager [phone] [facility] [phone]    Abigail Luna 09/27/2020, 11:19 AM

## 2020-09-27 NOTE — Transfer of Care (Signed)
Immediate Anesthesia Transfer of Care Note  Patient: Kamisha Voong  Procedure(s) Performed: REVERSE SHOULDER ARTHROPLASTY (Left Shoulder)  Patient Location: PACU  Anesthesia Type:General  Level of Consciousness: awake and alert   Airway & Oxygen Therapy: Patient Spontanous Breathing and Patient connected to nasal cannula oxygen  Post-op Assessment: Report given to RN and Post -op Vital signs reviewed and stable  Post vital signs: Reviewed and stable  Last Vitals:  Vitals Value Taken Time  BP 100/64 09/27/20 0954  Temp 36.9 C 09/27/20 0754  Pulse 102 09/27/20 0957  Resp 18 09/27/20 0957  SpO2 97 % 09/27/20 0957  Vitals shown include unvalidated device data.  Last Pain:  Vitals:   09/27/20 0842  TempSrc:   PainSc: 8       Patients Stated Pain Goal: 5 (09/26/20 1328)  Complications: No complications documented.

## 2020-09-28 LAB — CBC
HCT: 26.7 % — ABNORMAL LOW (ref 36.0–46.0)
Hemoglobin: 9.6 g/dL — ABNORMAL LOW (ref 12.0–15.0)
MCH: 34.4 pg — ABNORMAL HIGH (ref 26.0–34.0)
MCHC: 36 g/dL (ref 30.0–36.0)
MCV: 95.7 fL (ref 80.0–100.0)
Platelets: 94 10*3/uL — ABNORMAL LOW (ref 150–400)
RBC: 2.79 MIL/uL — ABNORMAL LOW (ref 3.87–5.11)
RDW: 14.6 % (ref 11.5–15.5)
WBC: 11.3 10*3/uL — ABNORMAL HIGH (ref 4.0–10.5)
nRBC: 0 % (ref 0.0–0.2)

## 2020-09-28 NOTE — Progress Notes (Signed)
Physical Therapy Treatment Patient Details Name: Abigail Luna MRN: [id number] DOB: [birth year] Today's Date: 09/28/2020    History of Present Illness Pt is 65 yo female who sustained ground level fall last week and found to have bil proximal humerus fx and L distal radius fx.  She was admitted on 09/26/20 for L reverse total shoulder.  L wrist and R humerus are being managed with immobilization.  Pt with medical hx including back pain, anxiety, spinal stenosis.    PT Comments    Patient received in recliner, she states she has been up in room some, agrees to walk out in hallway. Wants to go to bathroom first. Patient requires supervision for transfers and safety. Assist needed for pericare due to B UE in slings. She ambulates well with supervision. Her limitations are that she is unable to perform ADLs (cook, or manage bathing, dressing tasks) independently. Does not appear that she will be able to have 24 hour assist at home. Patient will benefit from short term SNF at discharge.        Follow Up Recommendations  Supervision/Assistance - 24 hour;SNF     Equipment Recommendations  None recommended by PT    Recommendations for Other Services       Precautions / Restrictions Precautions Precautions: Shoulder Type of Shoulder Precautions: reverse total shoulder Shoulder Interventions: Shoulder sling/immobilizer;Off for dressing/bathing/exercises Precaution Booklet Issued: Yes (comment) Precaution Comments: R shoulder: immobilization, NWB, shoulder sling.       L shoulder: reverse TSA, NWB, shoulder sling.  L wrist: cock up splint NWB.       Per MD: "appropriate for only activities of daily living and 2 pounds lifting with both right and left upper extremities."     AROM of bilat hands, R wrist, and bilat elbows; L shoulder pendulums Required Braces or Orthoses: Sling;Splint/Cast Splint/Cast: L wrist cock up; bilat slings Restrictions Weight Bearing Restrictions: Yes RUE Weight Bearing:  Non weight bearing LUE Weight Bearing: Non weight bearing    Mobility  Bed Mobility               General bed mobility comments: patient received in recliner and returned to recliner    Transfers Overall transfer level: Modified independent Equipment used: None Transfers: Sit to/from Stand Sit to Stand: Modified independent (Device/Increase time)         General transfer comment: sit to stand from recliner and toilet  Ambulation/Gait Ambulation/Gait assistance: Supervision Gait Distance (Feet): 200 Feet Assistive device: None Gait Pattern/deviations: Wide base of support Gait velocity: decreased   General Gait Details: slow cautious gait speed but stable without use of AD; cues around doorways to take care not to bump arms. Fatigued with ambulation of this distance   Social research officer, government Rankin (Stroke Patients Only)       Balance Overall balance assessment: Modified Independent Sitting-balance support: Feet supported Sitting balance-Leahy Scale: Normal     Standing balance support: No upper extremity supported;During functional activity Standing balance-Leahy Scale: Good                              Cognition Arousal/Alertness: Awake/alert Behavior During Therapy: WFL for tasks assessed/performed Overall Cognitive Status: Within Functional Limits for tasks assessed  Exercises      General Comments        Pertinent Vitals/Pain Pain Assessment: Faces Faces Pain Scale: Hurts a little bit Pain Location: soreness tail bone Pain Descriptors / Indicators: Discomfort Pain Intervention(s): Monitored during session    Home Living                      Prior Function            PT Goals (current goals can now be found in the care plan section) Acute Rehab PT Goals Patient Stated Goal: return to independence PT Goal Formulation: With  patient Time For Goal Achievement: 10/11/20 Potential to Achieve Goals: Good Progress towards PT goals: Progressing toward goals    Frequency    Min 3X/week      PT Plan Discharge plan needs to be updated    Co-evaluation              AM-PAC PT "6 Clicks" Mobility   Outcome Measure  Help needed turning from your back to your side while in a flat bed without using bedrails?: A Little Help needed moving from lying on your back to sitting on the side of a flat bed without using bedrails?: A Little Help needed moving to and from a bed to a chair (including a wheelchair)?: None Help needed standing up from a chair using your arms (e.g., wheelchair or bedside chair)?: None Help needed to walk in hospital room?: None Help needed climbing 3-5 steps with a railing? : A Little 6 Click Score: 21    End of Session   Activity Tolerance: Patient tolerated treatment well Patient left: in chair;with call bell/phone within reach;with chair alarm set;with family/visitor present Nurse Communication: Mobility status PT Visit Diagnosis: Unsteadiness on feet (R26.81)     Time: 1400-1430 PT Time Calculation (min) (ACUTE ONLY): 30 min  Charges:  $Gait Training: 23-37 mins                     Quynn Vilchis, PT, GCS 09/28/20,2:35 PM

## 2020-09-28 NOTE — Plan of Care (Signed)
  Problem: Activity: Goal: Ability to avoid complications of mobility impairment will improve Outcome: Progressing Goal: Ability to tolerate increased activity will improve Outcome: Progressing   Problem: Education: Goal: Verbalization of understanding the information provided will improve Outcome: Progressing   Problem: Coping: Goal: Level of anxiety will decrease Outcome: Progressing   Problem: Physical Regulation: Goal: Postoperative complications will be avoided or minimized Outcome: Progressing   Problem: Respiratory: Goal: Ability to maintain a clear airway will improve Outcome: Progressing   Problem: Skin Integrity: Goal: Signs of wound healing will improve Outcome: Progressing   Problem: Pain Management: Goal: Pain level will decrease Outcome: Progressing   Problem: Clinical Measurements: Goal: Ability to maintain clinical measurements within normal limits will improve Outcome: Progressing Goal: Will remain free from infection Outcome: Progressing Goal: Diagnostic test results will improve Outcome: Progressing Goal: Respiratory complications will improve Outcome: Progressing Goal: Cardiovascular complication will be avoided Outcome: Progressing

## 2020-09-28 NOTE — Plan of Care (Signed)
  Problem: Activity: Goal: Ability to avoid complications of mobility impairment will improve Outcome: Progressing Goal: Ability to tolerate increased activity will improve Outcome: Progressing   Problem: Coping: Goal: Level of anxiety will decrease Outcome: Progressing   Problem: Physical Regulation: Goal: Postoperative complications will be avoided or minimized Outcome: Progressing   Problem: Respiratory: Goal: Ability to maintain a clear airway will improve Outcome: Progressing   Problem: Skin Integrity: Goal: Signs of wound healing will improve Outcome: Progressing   Problem: Tissue Perfusion: Goal: Ability to maintain adequate tissue perfusion will improve Outcome: Progressing

## 2020-09-28 NOTE — Progress Notes (Signed)
Subjective: Abigail Luna is a 65 year old female 2 day status post left reverse total shoulder arthroplasty for fracture. She also has a left nondisplaced distal radius fracture being treated non-operatively and a right proximal humerus fracture being treated non-operatively.   Patient reports pain as mild to moderate.  She reports that her sore this morning and just recently pain medication.  Denies numbness or tingling.  Denies fever or chills.  She has gotten up and walked around.  She reports her primary difficulty is getting out of bed without assistance.  She is able to get out of the recliner on her own.  She reports she has had very small bowel movements and given MiraLAX this morning. Objective:   VITALS:   Vitals:   09/27/20 0754 09/27/20 1645 09/27/20 2006 09/28/20 0900  BP: (!) 158/62 (!) 149/51 (!) 151/52 (!) 165/58  Pulse: (!) 103 93 97 94  Resp: 17 17 17 18   Temp: 98.4 F (36.9 C) 97.7 F (36.5 C) 98.4 F (36.9 C) 97.6 F (36.4 C)  TempSrc: Oral Oral Oral Oral  SpO2: 96% 100% 98% 97%  Weight:      Height:       Constitutional: General Appearance: healthy-appearing, well-nourished, and well-developed. Level of Distress: no acute distress. Eyes: Lens (normal) clear: both eyes. Head: Head: normocephalic and atraumatic. Lungs: Respiratory effort: no dyspnea. Skin: Inspection and palpation: no rash, lesions Neurologic: Cranial Nerves: grossly intact. Sensation: grossly intact. Psychiatric: Insight: good judgement and insight. Mental Status: normal mood and affect and active and alert. Orientation: to time, place, and person.   Right Upper Extremity:  NV intact. Elbow ROM 10-120. Able to move all digits and wrist. Edema of the hand.    Left Upper Extremity:   INSPECTION & PALPATION: Aquacel dressing present to the left anterior shoulder without drainage. Edema of the hands.    SENSORY: sensation is intact to light touch in:  superficial radial nerve distribution  (dorsal first web space) median nerve distribution (tip of index finger)   ulnar nerve distribution (tip of small finger)    Axillary nerve distribution (lateral shoulder)   ROM: passive elbow ROM 20-110.TTP wrist with palpation. Mild pain with slight passive FF. Able to move digits.   MOTOR:  + motor posterior interosseous nerve (thumb IP extension) + anterior interosseous nerve (thumb IP flexion, index finger DIP flexion) + radial nerve (wrist extension) + median nerve (palpable firing thenar mass) + ulnar nerve (palpable firing of first dorsal interosseous muscle)    VASCULAR: 2+ radial pulse, brisk capillary refill < 2 sec, fingers warm and well-perfused    No calf pain bilaterally.   Lab Results  Component Value Date   WBC 11.3 (H) 09/28/2020   HGB 9.6 (L) 09/28/2020   HCT 26.7 (L) 09/28/2020   MCV 95.7 09/28/2020   PLT 94 (L) 09/28/2020   BMET    Component Value Date/Time   NA 131 (L) 09/27/2020 0253   K 4.7 09/27/2020 0253   CL 106 09/27/2020 0253   CO2 20 (L) 09/27/2020 0253   GLUCOSE 190 (H) 09/27/2020 0253   BUN 18 09/27/2020 0253   CREATININE 0.66 09/27/2020 0253   CALCIUM 9.5 09/27/2020 0253   GFRNONAA >60 09/27/2020 0253     Assessment/Plan: 2 Days Post-Op   Active Problems:   Closed fracture of left proximal humerus   Advance diet Up with therapy   Weightbearing Status:  Left Upper Extremity: Okay to begin gentle passive forward flexion ROM of the  left shoulder up to 90 degrees as tolerated. Okay to move elbow and digits passive/active. No lifting more than 2 pounds with the LUE. Sling should be on except for exercises, showering, ADLs. Sling can be relaxed when awake with pillows below under elbows.  Maintain left removable wrist splint except for hygiene, ADLs.   Right Upper Extremity: Okay to move elbow and digits. No lifting more than 2 pounds with the RUE. Sling should be on except for exercises, showering, ADLs.    DVT Prophylaxis:  Lovenox inpatient, Aspirin upon discharge  She has significant disability with bilateral proximal humerus fractures, she lives alone and is independent at baseline, and will need maximum help.   Anticipate she will either need to be discharged home with home health and 24-hour assistance versus skilled nursing facility.  She will be on Lovenox while inpatient and discharged home on aspirin   OT evaluation and PT determination is for this  patient it would be reasonable to go home if able to have 24-hour supervision with discharge to a friend's home with home health ordered.  I had a long discussion with the patient about this as she is unsure about the burde and placing this on her friends as they would have to help her with getting up from bed, going to the restroom, wiping in the restroom, and medication management as well as food.  She does report that her friends have offered to have them stay with her during this rehabilitation period.  We discussed if they are unable to provide the supervision needed then she would need to go to a skilled nursing facility for period of time.  She did have concerns with the possible level of care at a SNF in regards to prompt assistance with simple activities of daily living such as going to the restroom.  Recommend transition of care team to evaluate the patient and discuss options with her this may include speaking with the friend should  Would be staying with. We will check in tomorrow and see what she is considering with this treatment.   [name] 09/28/2020, 10:35 AM  Dion Saucier PA-C  Physician Assistant with Dr. Rebekah [place]

## 2020-09-28 NOTE — Progress Notes (Signed)
Occupational Therapy Treatment Patient Details Name: Abigail Luna MRN: [id number] DOB: Jun 10, 1955 Today's Date: 09/28/2020    History of present illness Pt is 65 yo female who sustained ground level fall last week and found to have bil proximal humerus fx and L distal radius fx.  She was admitted on 09/26/20 for L reverse total shoulder.  L wrist and R humerus are being managed with immobilization.  Pt with medical hx including back pain, anxiety, spinal stenosis.   OT comments  Radonna is doing well and pleasant for therapy. Session focused on bilateral UE exercises within precautions and education (see precautions & WB restrictions below). Grecia had notable improvement in ROM of available joints today however is still limited by pain and edema. Romanita tolerated about 15* of passive forward flexion of L shoulder. R hand remains swollen with slight improvement after exercises this session. Pt and pt's friend were educated on the importance of proper positioning when sitting in chair for swelling, pt verbalized understanding. Pt left with good positioning, well supported by bilat slings and pillows.  Pt continues to benefit from OT acutely to progress ROM and function in bilat UEs. Pt d/c plan updated to SNF, pt is unsure if she is comfortable having her friends provide the necessary level of care she is requiring (Total A for bathing, dressing, toileting).    Follow Up Recommendations  SNF;Supervision - Intermittent    Equipment Recommendations  Other (comment);Tub/shower bench;Tub/shower seat       Precautions / Restrictions Precautions Precautions: Shoulder Type of Shoulder Precautions: reverse total shoulder Shoulder Interventions: Shoulder sling/immobilizer;Off for dressing/bathing/exercises Precaution Booklet Issued: Yes (comment) Precaution Comments: R shoulder: immobilization, NWB, shoulder sling.    L shoulder: reverse TSA, NWB, shoulder sling.  L wrist: cock up splint NWB.       Per MD:  "appropriate for only activities of daily living and 2 pounds lifting with both right and left upper extremities."     AROM of bilat hands, R wrist, and bilat elbows; L shoulder pendulums Required Braces or Orthoses: Sling;Splint/Cast Splint/Cast: L wrist cock up; bilat slings Restrictions Weight Bearing Restrictions: Yes RUE Weight Bearing: Non weight bearing LUE Weight Bearing: Non weight bearing Other Position/Activity Restrictions: Left Upper Extremity: Okay to begin gentle passive forward flexion ROM of the left shoulder up to 90 degrees as tolerated. Okay to move elbow and digits passive/active. No lifting more than 2 pounds with the LUE. Sling should be on except for exercises, showering, ADLs. Sling can be relaxed when awake with pillows below under elbows.  Maintain left removable wrist splint except for hygiene, ADLs.      Right Upper Extremity: Okay to move elbow and digits. No lifting more than 2 pounds with the RUE. Sling should be on except for exercises, showering, ADLs.       Mobility Bed Mobility Overal bed mobility: Needs Assistance       General bed mobility comments: pt in chair upon arrival    Transfers Overall transfer level: Modified independent Equipment used: None Transfers: Sit to/from Stand Sit to Stand: Modified independent (Device/Increase time)         General transfer comment: sit to stand from recliner and toilet    Balance Overall balance assessment: Modified Independent Sitting-balance support: Feet supported Sitting balance-Leahy Scale: Normal     Standing balance support: No upper extremity supported;During functional activity Standing balance-Leahy Scale: Good        ADL either performed or assessed with clinical judgement   ADL  Overall ADL's : Needs assistance/impaired           Functional mobility during ADLs: Min guard General ADL Comments: session focused on bilateral UE exercises, edema control and positioning                Cognition Arousal/Alertness: Awake/alert Behavior During Therapy: WFL for tasks assessed/performed Overall Cognitive Status: Within Functional Limits for tasks assessed               General Comments: increased anxiety with movement              General Comments no new skin integrity issues noted, pt with moderate swelling in R hand    Pertinent Vitals/ Pain       Pain Assessment: Faces Faces Pain Scale: Hurts even more Pain Location:  (neck and bilat shoulers) Pain Descriptors / Indicators: Discomfort;Grimacing;Guarding Pain Intervention(s): Limited activity within patient's tolerance;Monitored during session         Frequency  Min 3X/week        Progress Toward Goals  OT Goals(current goals can now be found in the care plan section)  Progress towards OT goals: Progressing toward goals  Acute Rehab OT Goals Patient Stated Goal: return to independence OT Goal Formulation: With patient Time For Goal Achievement: 10/11/20 Potential to Achieve Goals: Fair ADL Goals Pt Will Perform Eating: with set-up;with adaptive utensils;sitting Pt Will Transfer to Toilet: Independently;ambulating Additional ADL Goal #1: Pt will indep complete HEP for L shoulder  Plan Discharge plan needs to be updated       AM-PAC OT "6 Clicks" Daily Activity     Outcome Measure   Help from another person eating meals?: A Lot Help from another person taking care of personal grooming?: A Lot Help from another person toileting, which includes using toliet, bedpan, or urinal?: A Lot Help from another person bathing (including washing, rinsing, drying)?: Total Help from another person to put on and taking off regular upper body clothing?: Total Help from another person to put on and taking off regular lower body clothing?: Total 6 Click Score: 9    End of Session    OT Visit Diagnosis: Muscle weakness (generalized) (M62.81);History of falling (Z91.81);Pain Pain - Right/Left:  Left Pain - part of body: Shoulder;Arm   Activity Tolerance Patient tolerated treatment well   Patient Left in chair;with call bell/phone within reach;with chair alarm set;with family/visitor present   Nurse Communication Mobility status;Weight bearing status;Precautions;Other (comment)        Time: 7035-0093 OT Time Calculation (min): 50 min  Charges: OT General Charges $OT Visit: 1 Visit OT Treatments $Therapeutic Exercise: 38-52 mins     Sunset Joshi A Esli Jernigan 09/28/2020, 4:23 PM

## 2020-09-29 MED ORDER — ASPIRIN EC 81 MG PO TBEC: 81.0000 mg | DELAYED_RELEASE_TABLET | Freq: Two times a day (BID) | ORAL | 0 refills | Status: AC

## 2020-09-29 MED ORDER — OXYCODONE HCL 5 MG PO TABS: 5.0000 mg | ORAL_TABLET | ORAL | 0 refills | Status: DC | PRN

## 2020-09-29 MED ORDER — METHOCARBAMOL 500 MG PO TABS: 500.0000 mg | ORAL_TABLET | Freq: Three times a day (TID) | ORAL | 1 refills | Status: AC | PRN

## 2020-09-29 NOTE — NC FL2 (Signed)
[place] MEDICAID FL2 LEVEL OF CARE SCREENING TOOL     IDENTIFICATION  Patient Name: Abigail Luna Birthdate: 08/16/1955 Sex: female Admission Date (Current Location): 09/26/2020  [facility] and [place] Number:  Producer, television/film/video and Address:  [facility], [address], [place], [place] [address]      Provider Number: [id number]  Attending Physician Name and Address:  Yolonda Kida, MD  Relative Name and Phone Number:  Reece Packer   [phone]    Current Level of Care: Hospital Recommended Level of Care: Skilled Nursing Facility Prior Approval Number:    Date Approved/Denied:   PASRR Number: PASRR pending  Discharge Plan: SNF    Current Diagnoses: Patient Active Problem List   Diagnosis Date Noted  . Closed fracture of left proximal humerus 09/26/2020    Orientation RESPIRATION BLADDER Height & Weight     Self,Time,Situation,Place  Normal Continent,External catheter Weight: 220 lb 0.3 oz (99.8 kg) Height:  5\' 1"  (154.9 cm)  BEHAVIORAL SYMPTOMS/MOOD NEUROLOGICAL BOWEL NUTRITION STATUS      Continent Diet (See D/C Summary)  AMBULATORY STATUS COMMUNICATION OF NEEDS Skin   Limited Assist Verbally Surgical wounds (ecchymosis surgical incision)                       Personal Care Assistance Level of Assistance  Bathing,Feeding,Dressing Bathing Assistance: Limited assistance Feeding assistance: Limited assistance Dressing Assistance: Maximum assistance     Functional Limitations Info  Sight,Hearing,Speech Sight Info: Adequate Hearing Info: Adequate Speech Info: Adequate    SPECIAL CARE FACTORS FREQUENCY  PT (By licensed PT),OT (By licensed OT)     PT Frequency: 5 X per week OT Frequency: 5 X per week            Contractures Contractures Info: Not present    Additional Factors Info  Code Status,Allergies Code Status Info: Full Allergies Info: Tolerates Prednisone in small doses, in higher doses cause  palpitations           Current Medications (09/29/2020):  This is the current hospital active medication list Current Facility-Administered Medications  Medication Dose Route Frequency Provider Last Rate Last Admin  . acetaminophen (TYLENOL) tablet 325-650 mg  325-650 mg Oral Q6H PRN 10/01/2020, MD   650 mg at 09/28/20 1723  . docusate sodium (COLACE) capsule 100 mg  100 mg Oral BID 09/30/20, MD   100 mg at 09/29/20 0858  . enoxaparin (LOVENOX) injection 40 mg  40 mg Subcutaneous Q24H 10/01/20, MD   40 mg at 09/29/20 0859  . HYDROcodone-acetaminophen (NORCO) 7.5-325 MG per tablet 1-2 tablet  1-2 tablet Oral Q4H PRN 09-10-1976, MD   2 tablet at 09/29/20 1137  . HYDROcodone-acetaminophen (NORCO/VICODIN) 5-325 MG per tablet 1-2 tablet  1-2 tablet Oral Q4H PRN 10/01/20, MD      . lisinopril (ZESTRIL) tablet 5 mg  5 mg Oral QHS Yolonda Kida, MD   5 mg at 09/28/20 1954  . menthol-cetylpyridinium (CEPACOL) lozenge 3 mg  1 lozenge Oral PRN 09/30/20, MD       Or  . phenol [facility]) mouth spray 1 spray  1 spray Mouth/Throat PRN [facility], MD      . methocarbamol (ROBAXIN) tablet 500 mg  500 mg Oral Q6H PRN Yolonda Kida, MD   500 mg at 09/29/20 10/01/20   Or  . methocarbamol (ROBAXIN) 500 mg in dextrose 5 %  50 mL IVPB  500 mg Intravenous Q6H PRN Yolonda Kida, MD      . metoCLOPramide [facility]) tablet 5-10 mg  5-10 mg Oral Q8H PRN Yolonda Kida, MD       Or  . metoCLOPramide [facility]) injection 5-10 mg  5-10 mg Intravenous Q8H PRN Yolonda Kida, MD      . morphine 2 MG/ML injection 0.5-1 mg  0.5-1 mg Intravenous Q2H PRN Yolonda Kida, MD   1 mg at 09/28/20 1021  . ondansetron (ZOFRAN) tablet 4 mg  4 mg Oral Q6H PRN Yolonda Kida, MD       Or  . ondansetron [facility]) injection 4 mg  4 mg Intravenous Q6H PRN Yolonda Kida, MD      . pantoprazole  (PROTONIX) EC tablet 40 mg  40 mg Oral Daily Yolonda Kida, MD   40 mg at 09/29/20 0858  . polyethylene glycol (MIRALAX / GLYCOLAX) packet 17 g  17 g Oral Daily PRN Yolonda Kida, MD   17 g at 09/28/20 3419     Discharge Medications: Please see discharge summary for a list of discharge medications.  Relevant Imaging Results:  Relevant Lab Results:   Additional Information SS#:[id number], [place]

## 2020-09-29 NOTE — Progress Notes (Signed)
Occupational Therapy Treatment Patient Details Name: Abigail Luna MRN: [id number] DOB: 09-14-1955 Today's Date: 09/29/2020    History of present illness Pt is 65 yo female who sustained ground level fall last week and found to have bil proximal humerus fx and L distal radius fx.  She was admitted on 09/26/20 for L reverse total shoulder.  L wrist and R humerus are being managed with immobilization.  Pt with medical hx including back pain, anxiety, spinal stenosis.   OT comments  Abigail Luna is progressing incrementally towards her goals, she continues to be limited by pain and bil UE injuries. Pt was in bathroom upon arrival, mod I for toilet transfer and ambulation, total A for toilet hygiene and lower body bathing. Pt completed all bilateral UE exercises, she requires AAROM of bilat elbows, R wrist, and bilat hands to get full ROM. Abigail Luna is limited to about 15* of forward gentle passive shoulder flexion due to pain. Abigail Luna simulated self feeding with good ability to reach "food" with a fork and bring to mouth with R hand without movement of R shoulder. She benefits from her food being cut up for her, placed very close to her on the tray table, slightly lower than chest height. RN aware. Abigail Luna continues to benefit from acute OT to progress exercises and ROM in BUE. D/c plan remains appropriate, pt reported that she is becoming more comfortable with the idea of going to a SNF for about a week.    Follow Up Recommendations  SNF;Supervision - Intermittent    Equipment Recommendations  Other (comment);Tub/shower bench;Tub/shower seat       Precautions / Restrictions Precautions Precautions: Shoulder Type of Shoulder Precautions: reverse total shoulder Shoulder Interventions: Shoulder sling/immobilizer;Off for dressing/bathing/exercises Precaution Booklet Issued: Yes (comment) Precaution Comments: R shoulder: immobilization, NWB, shoulder sling.    L shoulder: reverse TSA, NWB, shoulder sling.  L  wrist: cock up splint NWB.       Per MD: "appropriate for only activities of daily living and 2 pounds lifting with both right and left upper extremities."     AROM of bilat hands, R wrist, and bilat elbows; L shoulder pendulums Required Braces or Orthoses: Sling;Splint/Cast Splint/Cast: L wrist cock up; bilat slings Restrictions Weight Bearing Restrictions: Yes RUE Weight Bearing: Non weight bearing LUE Weight Bearing: Non weight bearing       Mobility Bed Mobility Overal bed mobility: Needs Assistance             General bed mobility comments: pt on toilet upon arrival    Transfers     Transfers: Sit to/from Stand Sit to Stand: Modified independent (Device/Increase time)         General transfer comment: sit to stand from recliner and toilet    Balance Overall balance assessment: Modified Independent Sitting-balance support: Feet supported Sitting balance-Leahy Scale: Normal     Standing balance support: No upper extremity supported;During functional activity Standing balance-Leahy Scale: Good         ADL either performed or assessed with clinical judgement   ADL Overall ADL's : Needs assistance/impaired Eating/Feeding: Sitting;Set up Eating/Feeding Details (indicate cue type and reason): requires set up for all packages and cutting of food, tray table set very close to pt with all available food placed as close as possible to her         Lower Body Bathing: Total assistance;Sit to/from stand Lower Body Bathing Details (indicate cue type and reason): pt noted with red rash under groinal and stomach folds, rn  notified         Toilet Transfer: Min guard;Comfort height toilet;Ambulation           Functional mobility during ADLs: Min guard General ADL Comments: session focused on bilateral UE exercises, edema control and positioning               Cognition Arousal/Alertness: Awake/alert Behavior During Therapy: WFL for tasks  assessed/performed Overall Cognitive Status: Within Functional Limits for tasks assessed           General Comments: increased anxiety with movement        Exercises Exercises: Shoulder Shoulder Exercises Pendulum Exercise: Seated;5 reps Shoulder Flexion: PROM;10 reps (gentle PROM, pt olny tolerates about 15*) Elbow Flexion: AAROM;Right;Left;10 reps;Seated Elbow Extension: AAROM;Right;Left;10 reps;Seated Wrist Flexion: Right;AAROM;10 reps;Seated Wrist Extension: AAROM;Right;Seated Digit Composite Flexion: AAROM;Right;Left;10 reps;Seated Composite Extension: AAROM;Right;Left;Seated   Shoulder Instructions Shoulder Instructions Donning/doffing sling/immobilizer: Maximal assistance Correct positioning of sling/immobilizer: Maximal assistance Pendulum exercises (written home exercise program): Minimal assistance ROM for elbow, wrist and digits of operated UE: Minimal assistance Sling wearing schedule (on at all times/off for ADL's): Patient able to independently direct caregiver Positioning of UE while sleeping: Patient able to independently direct caregiver     General Comments pt noted with red rash under groinal and stomach folds, RN notified    Pertinent Vitals/ Pain       Pain Assessment: 0-10 Pain Score: 7  Pain Location: back and L shoulder Pain Descriptors / Indicators: Grimacing;Guarding;Sore Pain Intervention(s): RN gave pain meds during session         Frequency  Min 3X/week        Progress Toward Goals  OT Goals(current goals can now be found in the care plan section)  Progress towards OT goals: Progressing toward goals  Acute Rehab OT Goals Patient Stated Goal: return to independence OT Goal Formulation: With patient Time For Goal Achievement: 10/11/20 Potential to Achieve Goals: Fair ADL Goals Pt Will Perform Eating: with set-up;with adaptive utensils;sitting Pt Will Transfer to Toilet: Independently;ambulating Additional ADL Goal #1: Pt will  indep complete HEP for L shoulder  Plan Discharge plan needs to be updated       AM-PAC OT "6 Clicks" Daily Activity     Outcome Measure   Help from another person eating meals?: A Little Help from another person taking care of personal grooming?: A Lot Help from another person toileting, which includes using toliet, bedpan, or urinal?: A Lot Help from another person bathing (including washing, rinsing, drying)?: Total Help from another person to put on and taking off regular upper body clothing?: Total Help from another person to put on and taking off regular lower body clothing?: Total 6 Click Score: 10    End of Session    OT Visit Diagnosis: Muscle weakness (generalized) (M62.81);History of falling (Z91.81);Pain Pain - Right/Left: Left Pain - part of body: Shoulder;Arm   Activity Tolerance Patient tolerated treatment well   Patient Left in chair;with call bell/phone within reach;with chair alarm set   Nurse Communication Mobility status;Weight bearing status;Precautions;Other (comment) (Pt needs pain medication, pt with rash)        Time: 4174-0814 OT Time Calculation (min): 37 min  Charges: OT General Charges $OT Visit: 1 Visit OT Treatments $Self Care/Home Management : 8-22 mins $Therapeutic Exercise: 23-37 mins     Dorotea Hand A Llewyn Heap 09/29/2020, 1:39 PM

## 2020-09-29 NOTE — Progress Notes (Signed)
Spoke with patient daughter this am and she has concerns about that patient will tell MD or CSW that she has some one that can stay with her and she doesn't. Daughter concern that she will be discharged home and be alone. Please address and call and speak with daughter. Ilean Skill LPN

## 2020-09-29 NOTE — Progress Notes (Signed)
  Progress Note   Date: 09/28/2020  Patient Name: Abigail Luna        MRN#: [id number]   Review of the patient's clinical findings supports the diagnosis of Hyponatremia.  Noted on routine post op labs.  Not clinically significant. Marland Kitchen

## 2020-09-29 NOTE — TOC Progression Note (Signed)
Transition of Care [facility]) - Progression Note    Patient Details  Name: Abigail Luna MRN: [id number] Date of Birth: 1955-06-30  Transition of Care [facility]) CM/SW Contact  Ralene Bathe, LCSWA Phone Number: 09/29/2020, 2:07 PM  Clinical Narrative:     RE: [name] Date of Birth: [birth year] Date: 09/29/2020  Please be advised that the above-named patient will require a short-term nursing home stay - anticipated 30 days or less for rehabilitation and strengthening.  The plan is for return home.  Expected Discharge Plan: Skilled Nursing Facility Barriers to Discharge: SNF Pending bed offer,Insurance Authorization,Continued Medical Work up  Expected Discharge Plan and Services Expected Discharge Plan: Skilled Nursing Facility       Living arrangements for the past 2 months: Single Family Home                                       Social Determinants of Health (SDOH) Interventions    Readmission Risk Interventions No flowsheet data found.

## 2020-09-29 NOTE — TOC Initial Note (Addendum)
Transition of Care [facility]) - Initial/Assessment Note    Patient Details  Name: Abigail Luna MRN: [id number] Date of Birth: 09-25-1955  Transition of Care [facility]) CM/SW Contact:    Ralene Bathe, LCSWA Phone Number: 09/29/2020, 11:29 AM  Clinical Narrative:                 CSW received consult for possible SNF placement at time of discharge. CSW spoke with patient.  Patient expressed understanding of PT recommendation and is agreeable to SNF placement at time of discharge. The patient reports that she only wants to be at the SNF for one week.  Patient does not have a preference and would like for CSW to fax out to all agencies and inform patient of who offers beds so that she and her friend, Amy, can make a choice.  CSW discussed insurance authorization process and provided Medicare SNF ratings list. Patient has received the COVID vaccines.  No further questions reported at this time.   CSW received verbal permission to speak Clarisa Kindred. CSW updated Ms. Abran Cantor on the d/c plan for SNF.    12:34-  CSW called Amy Abran Cantor after given verbal permission from Ms. Hatchell to inquire about Ms. Waldeck's SSN.  Ms. Abran Cantor will go to the patient's home to retrieve number.  Pending: SSN, PASSR, SNF bed offer, and insurance auth.    15:49-  CSW spoke with the patient and friend, Clarisa Kindred, the have accepted an offer a [facility].  CSW spoke with French Ana at [facility] and secured the bed offer.  French Ana will begin insurance authorization.  CSW called the attending's office and asked that a message be sent to ask the MD to sign the 30 day note for PASSR.   16:46- 30 day no signed, information faxed to PASSR.   Expected Discharge Plan: Skilled Nursing Facility Barriers to Discharge: SNF Pending bed offer,Insurance Authorization,Continued Medical Work up   Patient Goals and CMS Choice Patient states their goals for this hospitalization and ongoing recovery are:: To only be at the SNF for 1 week and go home. CMS  Medicare.gov Compare Post Acute Care list provided to:: Patient Choice offered to / list presented to : Patient  Expected Discharge Plan and Services Expected Discharge Plan: Skilled Nursing Facility       Living arrangements for the past 2 months: Single Family Home                                      Prior Living Arrangements/Services Living arrangements for the past 2 months: Single Family Home Lives with:: Self Patient language and need for interpreter reviewed:: Yes Do you feel safe going back to the place where you live?: Yes      Need for Family Participation in Patient Care: Yes (Comment) Care giver support system in place?: Yes (comment)   Criminal Activity/Legal Involvement Pertinent to Current Situation/Hospitalization: No - Comment as needed  Activities of Daily Living Home Assistive Devices/Equipment: Eyeglasses ADL Screening (condition at time of admission) Patient's cognitive ability adequate to safely complete daily activities?: Yes Is the patient deaf or have difficulty hearing?: No Does the patient have difficulty seeing, even when wearing glasses/contacts?: No Does the patient have difficulty concentrating, remembering, or making decisions?: No Patient able to express need for assistance with ADLs?: Yes Does the patient have difficulty dressing or bathing?: Yes Independently performs ADLs?: No Communication: Independent Dressing (  OT): Needs assistance Is this a change from baseline?: Pre-admission baseline Grooming: Needs assistance Is this a change from baseline?: Change from baseline, expected to last >3 days Feeding: Needs assistance Is this a change from baseline?: Change from baseline, expected to last >3 days Bathing: Needs assistance Is this a change from baseline?: Change from baseline, expected to last >3 days Toileting: Needs assistance Is this a change from baseline?: Change from baseline, expected to last >3days In/Out Bed:  Independent Walks in Home: Independent Does the patient have difficulty walking or climbing stairs?: No Weakness of Legs: None Weakness of Arms/Hands: Both  Permission Sought/Granted   Permission granted to share information with : Yes, Verbal Permission Granted  Share Information with NAME: Amy Abran Cantor  Permission granted to share info w AGENCY: SNF        Emotional Assessment Appearance:: Appears stated age Attitude/Demeanor/Rapport: Engaged Affect (typically observed): Accepting Orientation: : Oriented to Self,Oriented to Place,Oriented to  Time,Oriented to Situation Alcohol / Substance Use: Not Applicable Psych Involvement: No (comment)  Admission diagnosis:  Closed fracture of left proximal humerus [S42.202A] Patient Active Problem List   Diagnosis Date Noted  . Closed fracture of left proximal humerus 09/26/2020   PCP:  Paulina Fusi, MD Pharmacy:   [facility] [address] [place] - [address] [facility] [place] [address] Phone: [phone] Fax: [phone]     Social Determinants of Health (SDOH) Interventions    Readmission Risk Interventions No flowsheet data found.

## 2020-09-29 NOTE — Progress Notes (Signed)
Subjective: Abigail Luna is a 65 year old female 3 days status post left reverse total shoulder arthroplasty for fracture. She also has a left nondisplaced distal radius fracture being treated non-operatively and a right proximal humerus fracture being treated non-operatively.   Patient reports pain as mild to moderate.  Reports improvement today versus yesterday.  Reports has been passing gas inside small bowel movement but still feels feelings of constipation.  Denies numbness or tingling.  Denies fever or chills.  Reports she is worked well with therapy.  She is been up since early this morning sitting the chair.  She has been talked about transitioning to a skilled nursing facility.  She reports that her right arm has improvements and functional use. Objective:   VITALS:   Vitals:   09/29/20 0323 09/29/20 0640 09/29/20 0829 09/29/20 1326  BP: (!) 183/68 (!) 179/69 (!) 143/72 (!) 144/52  Pulse: (!) 102  91 84  Resp: 20  17 17   Temp: 97.7 F (36.5 C)  97.9 F (36.6 C) 97.6 F (36.4 C)  TempSrc: Oral  Oral Oral  SpO2: 99%  99% 100%  Weight:      Height:       Constitutional: General Appearance: healthy-appearing, well-nourished, and well-developed.  Bilateral slings Level of Distress: no acute distress. Eyes: Lens (normal) clear: both eyes. Head: Head: normocephalic and atraumatic. Lungs: Respiratory effort: no dyspnea. Skin: Inspection and palpation: no rash, lesions Neurologic: Cranial Nerves: grossly intact. Sensation: grossly intact. Psychiatric: Insight: good judgement and insight. Mental Status: normal mood and affect and active and alert. Orientation: to time, place, and person.   Right Upper Extremity:  NV intact. Elbow ROM 5-125. Able to move all digits and wrist.  Improving edema of the hand.    Left Upper Extremity:   INSPECTION & PALPATION: Aquacel dressing present to the left anterior shoulder with minimal drainage edema of the hands.  Removable wrist brace  present on the left wrist.   SENSORY: sensation is intact to light touch in:  superficial radial nerve distribution (dorsal first web space) median nerve distribution (tip of index finger)   ulnar nerve distribution (tip of small finger)    Axillary nerve distribution (lateral shoulder)   ROM: passive elbow ROM 15-115.TTP wrist with palpation. Mild pain with slight passive FF. Able to move digits.   MOTOR:  + motor posterior interosseous nerve (thumb IP extension) + anterior interosseous nerve (thumb IP flexion, index finger DIP flexion) + radial nerve (wrist extension) + median nerve (palpable firing thenar mass) + ulnar nerve (palpable firing of first dorsal interosseous muscle)    VASCULAR: 2+ radial pulse, brisk capillary refill < 2 sec, fingers warm and well-perfused    No calf pain bilaterally.   Lab Results  Component Value Date   WBC 11.3 (H) 09/28/2020   HGB 9.6 (L) 09/28/2020   HCT 26.7 (L) 09/28/2020   MCV 95.7 09/28/2020   PLT 94 (L) 09/28/2020   BMET    Component Value Date/Time   NA 131 (L) 09/27/2020 0253   K 4.7 09/27/2020 0253   CL 106 09/27/2020 0253   CO2 20 (L) 09/27/2020 0253   GLUCOSE 190 (H) 09/27/2020 0253   BUN 18 09/27/2020 0253   CREATININE 0.66 09/27/2020 0253   CALCIUM 9.5 09/27/2020 0253   GFRNONAA >60 09/27/2020 0253     Assessment/Plan: 3 Days Post-Op   Active Problems:   Closed fracture of left proximal humerus   Advance diet Up with therapy   Weightbearing  Status:  Left Upper Extremity: Okay to begin gentle passive forward flexion ROM of the left shoulder up to 90 degrees as tolerated. Okay to move elbow and digits passive/active. No lifting more than 2 pounds with the LUE. Sling should be on except for exercises, showering, ADLs. Sling can be relaxed when awake with pillows below under elbows.  Maintain left removable wrist splint except for hygiene, ADLs.   Right Upper Extremity: Okay to move elbow and digits. No lifting  more than 2 pounds with the RUE. Sling should be on except for exercises, showering, ADLs.   For constipation recommend another dose of miralax tonight. If continued feelings of constipation and small BM would recommend mag citrate.   DVT Prophylaxis: Lovenox inpatient, Aspirin upon discharge  OT evaluation today and after discussion with patient and her friend to be assisting with care following discharge determined that a skilled nursing facility would be the best option for this patient.  Transition of care team has spoken with the patient and is arranging for placement.  Ready for discharge upon placement.  Prescriptions for skilled nursing facility printed and placed in chart.   Patient will continue therapy while in the hospital.  She will be discharged to a skilled nursing facility and will follow up with [facility] around 10/10/20 with Marshayla Mitschke or Dr. Aundria Rud.    [name] 09/29/2020, 3:49 PM  Dion Saucier PA-C  Physician Assistant with Dr. Rebekah [place]

## 2020-09-30 MED ORDER — LORATADINE 10 MG PO TABS
10.0000 mg | ORAL_TABLET | Freq: Every day | ORAL | Status: DC
  Administered 2020-09-30 – 2020-10-04 (×5): 10 mg via ORAL
  Filled 2020-09-30 (×5): qty 1

## 2020-09-30 NOTE — Progress Notes (Signed)
Occupational Therapy Treatment Patient Details Name: Abigail Luna MRN: [id number] DOB: 1956/03/17 Today's Date: 09/30/2020    History of present illness Pt is 65 yo female who sustained ground level fall last week and found to have bil proximal humerus fx and L distal radius fx.  She was admitted on 09/26/20 for L reverse total shoulder.  L wrist and R humerus are being managed with immobilization.  Pt with medical hx including back pain, anxiety, spinal stenosis.   OT comments  Pt. Seen for skilled OT treatment session. Focus of session ROM B UEs.  Pts. Best friend present for session and able to demonstrate good technique with reapplication of slings and splint.  Educated on gentle massage to both hands and digits for edema management.    Follow Up Recommendations  SNF;Supervision - Intermittent    Equipment Recommendations  Other (comment);Tub/shower bench;Tub/shower seat    Recommendations for Other Services      Precautions / Restrictions Precautions Precautions: Shoulder Type of Shoulder Precautions: reverse total shoulder Shoulder Interventions: Shoulder sling/immobilizer;Off for dressing/bathing/exercises Precaution Comments: R shoulder: immobilization, NWB, shoulder sling.    L shoulder: reverse TSA, NWB, shoulder sling.  L wrist: cock up splint NWB.       Per MD: "appropriate for only activities of daily living and 2 pounds lifting with both right and left upper extremities."     AROM of bilat hands, R wrist, and bilat elbows; L shoulder pendulums Required Braces or Orthoses: Sling;Splint/Cast Splint/Cast: L wrist cock up; bilat slings Restrictions Weight Bearing Restrictions: Yes RUE Weight Bearing: Non weight bearing LUE Weight Bearing: Non weight bearing Other Position/Activity Restrictions: Left Upper Extremity: Okay to begin gentle passive forward flexion ROM of the left shoulder up to 90 degrees as tolerated. Okay to move elbow and digits passive/active. No lifting more  than 2 pounds with the LUE. Sling should be on except for exercises, showering, ADLs. Sling can be relaxed when awake with pillows below under elbows.  Maintain left removable wrist splint except for hygiene, ADLs.      Right Upper Extremity: Okay to move elbow and digits. No lifting more than 2 pounds with the RUE. Sling should be on except for exercises, showering, ADLs.       Mobility Bed Mobility                    Transfers                      Balance                                           ADL either performed or assessed with clinical judgement   ADL                                               Vision       Perception     Praxis      Cognition Arousal/Alertness: Awake/alert Behavior During Therapy: WFL for tasks assessed/performed Overall Cognitive Status: Within Functional Limits for tasks assessed  Exercises Shoulder Exercises Pendulum Exercise: Seated;5 reps;Left Shoulder Flexion: PROM;10 reps Elbow Flexion: AAROM;10 reps;Seated;Both Elbow Extension: AAROM;10 reps;Seated;Both Wrist Flexion:  (noted afterwards for R wrist only and reviewed with pt. and rn) Wrist Extension: AAROM;Seated;Both (noted afterwards for R wrist only and reviewed with pt. and rn) Digit Composite Flexion: AAROM;10 reps;Seated;Both Composite Extension: AAROM;Seated;Both Other Exercises Other Exercises: reviewe gentle massage of B digits and tops of hands for edema management   Shoulder Instructions Shoulder Instructions Donning/doffing sling/immobilizer: Patient able to independently direct caregiver;Minimal assistance Correct positioning of sling/immobilizer: Minimal assistance;Patient able to independently direct caregiver Pendulum exercises (written home exercise program): Minimal assistance ROM for elbow, wrist and digits of operated UE: Minimal assistance Sling  wearing schedule (on at all times/off for ADL's): Patient able to independently direct caregiver Positioning of UE while sleeping: Patient able to independently direct caregiver     General Comments  pt. And best friend met 30 years ago at [employer] where fall occurred.  They LOVE hallmark movies and check them out at [employer] to watch together.      Pertinent Vitals/ Pain       Pain Assessment: Faces Faces Pain Scale: Hurts a little bit Pain Location: back and L shoulder Pain Descriptors / Indicators: Grimacing;Guarding;Sore Pain Intervention(s): Limited activity within patient's tolerance;Monitored during session;Repositioned  Home Living                                          Prior Functioning/Environment              Frequency  Min 3X/week        Progress Toward Goals  OT Goals(current goals can now be found in the care plan section)  Progress towards OT goals: Progressing toward goals     Plan      Co-evaluation                 AM-PAC OT "6 Clicks" Daily Activity     Outcome Measure   Help from another person eating meals?: A Little Help from another person taking care of personal grooming?: A Lot Help from another person toileting, which includes using toliet, bedpan, or urinal?: A Lot Help from another person bathing (including washing, rinsing, drying)?: Total Help from another person to put on and taking off regular upper body clothing?: Total Help from another person to put on and taking off regular lower body clothing?: Total 6 Click Score: 10    End of Session    OT Visit Diagnosis: Muscle weakness (generalized) (M62.81);History of falling (Z91.81);Pain Pain - Right/Left: Left Pain - part of body: Shoulder;Arm   Activity Tolerance Patient tolerated treatment well   Patient Left in chair;with call bell/phone within reach;with chair alarm set;with family/visitor present   Nurse Communication          Time:  7096-2836 OT Time Calculation (min): 25 min  Charges: OT General Charges $OT Visit: 1 Visit OT Treatments $Therapeutic Exercise: 23-37 mins      Clearnce Sorrel -COTA/L  09/30/2020, 12:58 PM

## 2020-09-30 NOTE — Plan of Care (Signed)
  Problem: Activity: Goal: Ability to avoid complications of mobility impairment will improve 09/30/2020 0734 by Carylon Perches, LPN Outcome: Progressing 09/30/2020 0228 by Carylon Perches, LPN Outcome: Progressing Goal: Ability to tolerate increased activity will improve 09/30/2020 0734 by Carylon Perches, LPN Outcome: Progressing 09/30/2020 0228 by Carylon Perches, LPN Outcome: Progressing   Problem: Education: Goal: Verbalization of understanding the information provided will improve 09/30/2020 0734 by Carylon Perches, LPN Outcome: Progressing 09/30/2020 0228 by Carylon Perches, LPN Outcome: Progressing   Problem: Coping: Goal: Level of anxiety will decrease 09/30/2020 0734 by Carylon Perches, LPN Outcome: Progressing 09/30/2020 0228 by Carylon Perches, LPN Outcome: Progressing   Problem: Physical Regulation: Goal: Postoperative complications will be avoided or minimized 09/30/2020 0734 by Carylon Perches, LPN Outcome: Progressing 09/30/2020 0228 by Carylon Perches, LPN Outcome: Progressing   Problem: Respiratory: Goal: Ability to maintain a clear airway will improve 09/30/2020 0734 by Carylon Perches, LPN Outcome: Progressing 09/30/2020 0228 by Carylon Perches, LPN Outcome: Progressing   Problem: Pain Management: Goal: Pain level will decrease 09/30/2020 0734 by Carylon Perches, LPN Outcome: Progressing 09/30/2020 0228 by Carylon Perches, LPN Outcome: Progressing   Problem: Skin Integrity: Goal: Signs of wound healing will improve 09/30/2020 0734 by Carylon Perches, LPN Outcome: Progressing 09/30/2020 0228 by Carylon Perches, LPN Outcome: Progressing   Problem: Tissue Perfusion: Goal: Ability to maintain adequate tissue perfusion will improve 09/30/2020 0734 by Carylon Perches, LPN Outcome: Progressing 09/30/2020 0228 by Carylon Perches, LPN Outcome: Progressing   Problem: Education: Goal: Knowledge of General Education information  will improve Description: Including pain rating scale, medication(s)/side effects and non-pharmacologic comfort measures 09/30/2020 0734 by Carylon Perches, LPN Outcome: Progressing 09/30/2020 0228 by Carylon Perches, LPN Outcome: Progressing   Problem: Health Behavior/Discharge Planning: Goal: Ability to manage health-related needs will improve 09/30/2020 0734 by Carylon Perches, LPN Outcome: Progressing 09/30/2020 0228 by Carylon Perches, LPN Outcome: Progressing   Problem: Clinical Measurements: Goal: Ability to maintain clinical measurements within normal limits will improve 09/30/2020 0734 by Carylon Perches, LPN Outcome: Progressing 09/30/2020 0228 by Carylon Perches, LPN Outcome: Progressing Goal: Will remain free from infection 09/30/2020 0734 by Carylon Perches, LPN Outcome: Progressing 09/30/2020 0228 by Carylon Perches, LPN Outcome: Progressing Goal: Diagnostic test results will improve 09/30/2020 0734 by Carylon Perches, LPN Outcome: Progressing 09/30/2020 0228 by Carylon Perches, LPN Outcome: Progressing Goal: Respiratory complications will improve 09/30/2020 0734 by Carylon Perches, LPN Outcome: Progressing 09/30/2020 0228 by Carylon Perches, LPN Outcome: Progressing Goal: Cardiovascular complication will be avoided 09/30/2020 0734 by Carylon Perches, LPN Outcome: Progressing 09/30/2020 0228 by Carylon Perches, LPN Outcome: Progressing   Problem: Activity: Goal: Risk for activity intolerance will decrease 09/30/2020 0734 by Carylon Perches, LPN Outcome: Progressing 09/30/2020 0228 by Carylon Perches, LPN Outcome: Progressing   Problem: Nutrition: Goal: Adequate nutrition will be maintained 09/30/2020 0734 by Carylon Perches, LPN Outcome: Progressing 09/30/2020 0228 by Carylon Perches, LPN Outcome: Progressing   Problem: Elimination: Goal: Will not experience complications related to bowel motility 09/30/2020 0734 by Carylon Perches, LPN Outcome: Progressing 09/30/2020 0228 by Carylon Perches, LPN Outcome: Progressing Goal: Will not experience complications related to urinary retention 09/30/2020 0734 by Carylon Perches, LPN Outcome: Progressing 09/30/2020 0228 by Carylon Perches, LPN Outcome: Progressing

## 2020-09-30 NOTE — Plan of Care (Signed)
  Problem: Activity: Goal: Ability to avoid complications of mobility impairment will improve Outcome: Progressing Goal: Ability to tolerate increased activity will improve Outcome: Progressing   Problem: Education: Goal: Verbalization of understanding the information provided will improve Outcome: Progressing   Problem: Coping: Goal: Level of anxiety will decrease Outcome: Progressing   Problem: Physical Regulation: Goal: Postoperative complications will be avoided or minimized Outcome: Progressing   Problem: Respiratory: Goal: Ability to maintain a clear airway will improve Outcome: Progressing   Problem: Pain Management: Goal: Pain level will decrease Outcome: Progressing   Problem: Skin Integrity: Goal: Signs of wound healing will improve Outcome: Progressing   Problem: Tissue Perfusion: Goal: Ability to maintain adequate tissue perfusion will improve Outcome: Progressing   Problem: Education: Goal: Knowledge of General Education information will improve Description: Including pain rating scale, medication(s)/side effects and non-pharmacologic comfort measures Outcome: Progressing   Problem: Health Behavior/Discharge Planning: Goal: Ability to manage health-related needs will improve Outcome: Progressing   Problem: Clinical Measurements: Goal: Ability to maintain clinical measurements within normal limits will improve Outcome: Progressing Goal: Will remain free from infection Outcome: Progressing Goal: Diagnostic test results will improve Outcome: Progressing Goal: Respiratory complications will improve Outcome: Progressing Goal: Cardiovascular complication will be avoided Outcome: Progressing   Problem: Activity: Goal: Risk for activity intolerance will decrease Outcome: Progressing   Problem: Nutrition: Goal: Adequate nutrition will be maintained Outcome: Progressing   Problem: Coping: Goal: Level of anxiety will decrease Outcome: Progressing    Problem: Elimination: Goal: Will not experience complications related to bowel motility Outcome: Progressing Goal: Will not experience complications related to urinary retention Outcome: Progressing   Problem: Pain Managment: Goal: General experience of comfort will improve Outcome: Progressing   Problem: Safety: Goal: Ability to remain free from injury will improve Outcome: Progressing   Problem: Skin Integrity: Goal: Risk for impaired skin integrity will decrease Outcome: Progressing   

## 2020-09-30 NOTE — Progress Notes (Signed)
Subjective: Abigail Luna is a 64 year old female 3 days status post left reverse total shoulder arthroplasty for fracture. She also has a left nondisplaced distal radius fracture being treated non-operatively and a right proximal humerus fracture being treated non-operatively.   Patient reports pain as mild to moderate.  Reports improvement today versus yesterday.  Reports has been passing gas, (+) bowel movement but still feels feelings of constipation.  Denies numbness or tingling.  Denies fever or chills.  Reports she is worked well with therapy.  She is been up since early this morning sitting the chair.  She has been talked about transitioning to a skilled nursing facility.  She reports that her right arm has improvements and functional use. Objective:   VITALS:   Vitals:   09/29/20 0829 09/29/20 1326 09/29/20 2031 09/30/20 0500  BP: (!) 143/72 (!) 144/52 (!) 156/59 (!) 165/59  Pulse: 91 84 98 95  Resp: 17 17 18 18   Temp: 97.9 F (36.6 C) 97.6 F (36.4 C) 98.2 F (36.8 C) 98 F (36.7 C)  TempSrc: Oral Oral Oral Oral  SpO2: 99% 100% 98% 98%  Weight:      Height:       Constitutional: General Appearance: healthy-appearing, well-nourished, and well-developed.  Bilateral slings Level of Distress: no acute distress. Eyes: Lens (normal) clear: both eyes. Head: Head: normocephalic and atraumatic. Lungs: Respiratory effort: no dyspnea. Skin: Inspection and palpation: no rash, lesions Neurologic: Cranial Nerves: grossly intact. Sensation: grossly intact. Psychiatric: Insight: good judgement and insight. Mental Status: normal mood and affect and active and alert. Orientation: to time, place, and person.   Right Upper Extremity:  NV intact. Elbow ROM 5-125. Able to move all digits and wrist.  Improving edema of the hand.    Left Upper Extremity:   INSPECTION & PALPATION: Aquacel dressing present to the left anterior shoulder with minimal drainage edema of the hands.  Removable  wrist brace present on the left wrist.   SENSORY: sensation is intact to light touch in:  superficial radial nerve distribution (dorsal first web space) median nerve distribution (tip of index finger)   ulnar nerve distribution (tip of small finger)    Axillary nerve distribution (lateral shoulder)   ROM: passive elbow ROM 15-115.TTP wrist with palpation. Mild pain with slight passive FF. Able to move digits.   MOTOR:  + motor posterior interosseous nerve (thumb IP extension) + anterior interosseous nerve (thumb IP flexion, index finger DIP flexion) + radial nerve (wrist extension) + median nerve (palpable firing thenar mass) + ulnar nerve (palpable firing of first dorsal interosseous muscle)    VASCULAR: 2+ radial pulse, brisk capillary refill < 2 sec, fingers warm and well-perfused    No calf pain bilaterally.   Lab Results  Component Value Date   WBC 11.3 (H) 09/28/2020   HGB 9.6 (L) 09/28/2020   HCT 26.7 (L) 09/28/2020   MCV 95.7 09/28/2020   PLT 94 (L) 09/28/2020   BMET    Component Value Date/Time   NA 131 (L) 09/27/2020 0253   K 4.7 09/27/2020 0253   CL 106 09/27/2020 0253   CO2 20 (L) 09/27/2020 0253   GLUCOSE 190 (H) 09/27/2020 0253   BUN 18 09/27/2020 0253   CREATININE 0.66 09/27/2020 0253   CALCIUM 9.5 09/27/2020 0253   GFRNONAA >60 09/27/2020 0253     Assessment/Plan: 4 Days Post-Op   Active Problems:   Closed fracture of left proximal humerus   Advance diet Up with therapy  Weightbearing Status:  Left Upper Extremity: Okay to begin gentle passive forward flexion ROM of the left shoulder up to 90 degrees as tolerated. Okay to move elbow and digits passive/active. No lifting more than 2 pounds with the LUE. Sling should be on except for exercises, showering, ADLs. Sling can be relaxed when awake with pillows below under elbows.  Maintain left removable wrist splint except for hygiene, ADLs.   Right Upper Extremity: Okay to move elbow and  digits. No lifting more than 2 pounds with the RUE. Sling should be on except for exercises, showering, ADLs.   For constipation recommend another dose of miralax tonight. If continued feelings of constipation and small BM would recommend mag citrate.   DVT Prophylaxis: Lovenox inpatient, Aspirin upon discharge  OT evaluation today and after discussion with patient and her friend to be assisting with care following discharge determined that a skilled nursing facility would be the best option for this patient.  Transition of care team has spoken with the patient and is arranging for placement.  Ready for discharge upon placement.  Prescriptions for skilled nursing facility printed and placed in chart.   Patient will continue therapy while in the hospital.  She will be discharged to a skilled nursing facility and will follow up with [facility] around 10/10/20 with Abigail Luna or Dr. Aundria Luna.    Abigail Luna 09/30/2020, 1:44 PM

## 2020-10-01 NOTE — Progress Notes (Addendum)
Occupational Therapy Treatment Patient Details Name: Abigail Luna MRN: [id number] DOB: 03-29-1956 Today's Date: 10/01/2020    History of present illness Pt is 65 yo female who sustained ground level fall last week and found to have bil proximal humerus fx and L distal radius fx.  She was admitted on 09/26/20 for L reverse total shoulder.  L wrist and R humerus are being managed with immobilization.  Pt with medical hx including back pain, anxiety, spinal stenosis.   OT comments  Pt. Seen for skilled OT treatment session.  Able to complete B UE exercises/ROM with good return demo and technique.  Reports feeling eager to transfer to snf when able. Will address self feeding next session to determine any modifications needed to increase independence with with this task while adhering to current precautions.    Follow Up Recommendations  SNF;Supervision - Intermittent    Equipment Recommendations  Other (comment);Tub/shower bench;Tub/shower seat    Recommendations for Other Services      Precautions / Restrictions Precautions Precautions: Shoulder Type of Shoulder Precautions: reverse total shoulder Shoulder Interventions: Shoulder sling/immobilizer;Off for dressing/bathing/exercises Precaution Comments: R shoulder: immobilization, NWB, shoulder sling.    L shoulder: reverse TSA, NWB, shoulder sling.  L wrist: cock up splint NWB.       Per MD: "appropriate for only activities of daily living and 2 pounds lifting with both right and left upper extremities."     AROM of bilat hands, R wrist, and bilat elbows; L shoulder pendulums Required Braces or Orthoses: Sling;Splint/Cast Splint/Cast: L wrist cock up; bilat slings Restrictions Other Position/Activity Restrictions: Left Upper Extremity: Okay to begin gentle passive forward flexion ROM of the left shoulder up to 90 degrees as tolerated. Okay to move elbow and digits passive/active. No lifting more than 2 pounds with the LUE. Sling should be on  except for exercises, showering, ADLs. Sling can be relaxed when awake with pillows below under elbows.  Maintain left removable wrist splint except for hygiene, ADLs.      Right Upper Extremity: Okay to move elbow and digits. No lifting more than 2 pounds with the RUE. Sling should be on except for exercises, showering, ADLs.       Mobility Bed Mobility                    Transfers                      Balance                                           ADL either performed or assessed with clinical judgement   ADL                                               Vision       Perception     Praxis      Cognition Arousal/Alertness: Awake/alert Behavior During Therapy: WFL for tasks assessed/performed Overall Cognitive Status: Within Functional Limits for tasks assessed  Exercises Shoulder Exercises Pendulum Exercise: 5 reps;Left;Standing Elbow Flexion: AAROM;10 reps;Seated;Both Elbow Extension: AAROM;10 reps;Seated;Both Wrist Flexion: AROM;Seated;Right Wrist Extension: AAROM;Seated;Right Digit Composite Flexion: AAROM;10 reps;Seated;Both Composite Extension: AAROM;Seated;Both   Shoulder Instructions       General Comments      Pertinent Vitals/ Pain       Pain Assessment: Faces Faces Pain Scale: Hurts little more Pain Location: L shoulder towards the front-states she got "startled" while sleeping last night and thinks she hit her self when she jerked maybe with the call bell and hit her L arm.  States she notified cna when it occurred.   Pain Descriptors / Indicators: Grimacing;Guarding;Sore Pain Intervention(s): Limited activity within patient's tolerance;Monitored during session;Repositioned  Home Living                                          Prior Functioning/Environment              Frequency  Min 3X/week         Progress Toward Goals  OT Goals(current goals can now be found in the care plan section)  Progress towards OT goals: Progressing toward goals     Plan Discharge plan remains appropriate    Co-evaluation                 AM-PAC OT "6 Clicks" Daily Activity     Outcome Measure   Help from another person eating meals?: A Little Help from another person taking care of personal grooming?: A Lot Help from another person toileting, which includes using toliet, bedpan, or urinal?: A Lot Help from another person bathing (including washing, rinsing, drying)?: Total Help from another person to put on and taking off regular upper body clothing?: Total Help from another person to put on and taking off regular lower body clothing?: Total 6 Click Score: 10    End of Session    OT Visit Diagnosis: Muscle weakness (generalized) (M62.81);History of falling (Z91.81);Pain Pain - Right/Left: Left Pain - part of body: Shoulder;Arm   Activity Tolerance Patient tolerated treatment well   Patient Left in chair;with call bell/phone within reach   Nurse Communication Other (comment) (after session rn requests to see if we have a way to adapt pt. utensils to aide in increased rom to reach mouth with more ease)        Time: 2694-8546 OT Time Calculation (min): 17 min  Charges: OT General Charges $OT Visit: 1 Visit OT Treatments $Therapeutic Exercise: 8-22 mins  Boneta Lucks, COTA/L Acute Rehabilitation [phone]  10/01/2020, 11:12 AM

## 2020-10-01 NOTE — Plan of Care (Signed)
  Problem: Activity: Goal: Ability to avoid complications of mobility impairment will improve Outcome: Progressing Goal: Ability to tolerate increased activity will improve Outcome: Progressing   Problem: Physical Regulation: Goal: Postoperative complications will be avoided or minimized Outcome: Progressing   Problem: Respiratory: Goal: Ability to maintain a clear airway will improve Outcome: Progressing   Problem: Pain Management: Goal: Pain level will decrease Outcome: Progressing   Problem: Skin Integrity: Goal: Signs of wound healing will improve Outcome: Progressing   Problem: Health Behavior/Discharge Planning: Goal: Ability to manage health-related needs will improve Outcome: Progressing   Problem: Elimination: Goal: Will not experience complications related to bowel motility Outcome: Progressing Goal: Will not experience complications related to urinary retention Outcome: Progressing   Problem: Safety: Goal: Ability to remain free from injury will improve Outcome: Progressing

## 2020-10-02 MED ORDER — POLYETHYLENE GLYCOL 3350 17 G PO PACK
17.0000 g | PACK | Freq: Every day | ORAL | Status: AC
  Administered 2020-10-03: 17 g via ORAL
  Filled 2020-10-02: qty 1

## 2020-10-02 NOTE — Care Management Important Message (Signed)
Important Message  Patient Details  Name: Abigail Luna MRN: [id number] Date of Birth: 1955-10-12   Medicare Important Message Given:  Yes     Louay Myrie P Jermarion Poffenberger 10/02/2020, 2:25 PM

## 2020-10-02 NOTE — TOC Progression Note (Addendum)
Transition of Care [facility]) - Progression Note    Patient Details  Name: Abigail Luna MRN: [id number] Date of Birth: 01-15-1956  Transition of Care [facility]) CM/SW Contact  Ralene Bathe, LCSWA Phone Number: 10/02/2020, 10:47 AM  Clinical Narrative:    CSW contacted French Ana with [facility] to inquire about insurance auth.  CSW was informed that insurance auth. Is still pending.   PASSR= [id number]   Expected Discharge Plan: Skilled Nursing Facility Barriers to Discharge: SNF Pending bed offer,Insurance Authorization,Continued Medical Work up  Expected Discharge Plan and Services Expected Discharge Plan: Skilled Nursing Facility       Living arrangements for the past 2 months: Single Family Home                                       Social Determinants of Health (SDOH) Interventions    Readmission Risk Interventions No flowsheet data found.

## 2020-10-02 NOTE — Progress Notes (Signed)
Subjective: Abigail Luna is a 65 year old female  days status post left reverse total shoulder arthroplasty for fracture performed on 09/26/20. She also has a left nondisplaced distal radius fracture being treated non-operatively and a right proximal humerus fracture being treated non-operatively.   Patient reports pain as mild to moderate.  She reports she has been having bowel movements, requesting to have her miralax in the morning. Denies numbness or tingling. Denies fever or chills. Reports she would like to move around more and get up from the chair.  Objective:   VITALS:   Vitals:   10/01/20 2035 10/01/20 2215 10/02/20 0359 10/02/20 0804  BP: (!) 120/51 (!) 174/68 (!) 165/61 (!) 164/66  Pulse: 97 86 100 82  Resp: 18 18 19 17   Temp: (!) 97 F (36.1 C) 98.7 F (37.1 C) 98 F (36.7 C) 98 F (36.7 C)  TempSrc: Oral  Oral Oral  SpO2: 98% 100% 99% 100%  Weight:      Height:       Constitutional: General Appearance: healthy-appearing, well-nourished, and well-developed.  Bilateral slings Level of Distress: no acute distress. Eyes: Lens (normal) clear: both eyes. Head: Head: normocephalic and atraumatic. Lungs: Respiratory effort: no dyspnea. Skin: Inspection and palpation: no rash, lesions Neurologic: Cranial Nerves: grossly intact. Sensation: grossly intact. Psychiatric: Insight: good judgement and insight. Mental Status: normal mood and affect and active and alert. Orientation: to time, place, and person.   Right Upper Extremity:  NV intact. Elbow ROM 0-125. Able to move all digits and wrist.  Improving edema of the hand.    Left Upper Extremity:   INSPECTION & PALPATION: Aquacel dressing present to the left anterior shoulder with minimal drainage, edema of the hands.  Removable wrist brace present on the left wrist.   SENSORY: sensation is intact to light touch in:  superficial radial nerve distribution (dorsal first web space) median nerve distribution (tip of index  finger)   ulnar nerve distribution (tip of small finger)    Axillary nerve distribution (lateral shoulder)   ROM: passive elbow ROM 10-120.TTP wrist with palpation. . Able to move digits.   MOTOR:  + motor posterior interosseous nerve (thumb IP extension) + anterior interosseous nerve (thumb IP flexion, index finger DIP flexion) + radial nerve (wrist extension) + median nerve (palpable firing thenar mass) + ulnar nerve (palpable firing of first dorsal interosseous muscle)    VASCULAR: 2+ radial pulse, brisk capillary refill < 2 sec, fingers warm and well-perfused b/l    No calf pain bilaterally.   Lab Results  Component Value Date   WBC 11.3 (H) 09/28/2020   HGB 9.6 (L) 09/28/2020   HCT 26.7 (L) 09/28/2020   MCV 95.7 09/28/2020   PLT 94 (L) 09/28/2020   BMET    Component Value Date/Time   NA 131 (L) 09/27/2020 0253   K 4.7 09/27/2020 0253   CL 106 09/27/2020 0253   CO2 20 (L) 09/27/2020 0253   GLUCOSE 190 (H) 09/27/2020 0253   BUN 18 09/27/2020 0253   CREATININE 0.66 09/27/2020 0253   CALCIUM 9.5 09/27/2020 0253   GFRNONAA >60 09/27/2020 0253     Assessment/Plan: 6 Days Post-Op   Active Problems:   Closed fracture of left proximal humerus   Advance diet Up with therapy   Weightbearing Status:  Left Upper Extremity: Okay to begin gentle passive forward flexion ROM of the left shoulder up to 90 degrees as tolerated. Okay to move elbow and digits passive/active. No lifting more than  2 pounds with the LUE. Sling should be on except for exercises, showering, ADLs. Sling can be relaxed when awake with pillows below under elbows.  Maintain left removable wrist splint except for hygiene, ADLs.   Right Upper Extremity: Okay to move elbow and digits. No lifting more than 2 pounds with the RUE. Sling should be on except for exercises, showering, ADLs.   OK to shower with assistance, bandage is waterproof, just need to remove slings and wrist splint. Place back on after  shower.   Adjusted miralax to schedule at 0730.   DVT Prophylaxis: Lovenox inpatient, Aspirin upon discharge  Per [facility] team patient awaiting placement and insurance authorization.  Ready for discharge upon placement.  Prescriptions for skilled nursing facility printed and placed in chart.   Patient will continue therapy while in the hospital.  She will be discharged to a skilled nursing facility and will follow up with [facility] around 10/10/20 with Payden Docter or Dr. Aundria Rud.    Abigail Luna 10/02/2020, 11:14 AM  Dion Saucier PA-C  Physician Assistant with Dr. Rebekah [place]

## 2020-10-02 NOTE — Progress Notes (Signed)
Bedside shift report complete. Received patient awake,alert.orientedx4 and able to verbalize needs.NAD noted; respirations easy/even on room air. Dressing to L shoulder c/d/i. Splint to L wrist in place. Bilateral slings to upper extremities in place; grip and sensation intact. Edema noted to b/l lower extremities. Movement/sensation to all extremities intact. Whiteboard updated. All safety measures in place and personal belongings within reach.

## 2020-10-02 NOTE — Progress Notes (Signed)
Occupational Therapy Treatment Patient Details Name: Kaley Jutras MRN: [id number] DOB: 01-18-1956 Today's Date: 10/02/2020    History of present illness Pt is 65 yo female who sustained ground level fall last week and found to have bil proximal humerus fx and L distal radius fx.  She was admitted on 09/26/20 for L reverse total shoulder.  L wrist and R humerus are being managed with immobilization.  Pt with medical hx including back pain, anxiety, spinal stenosis.   OT comments  Shaneque is doing well, with plans to d/c to SNF soon. Jimi is tolerating all PROM and AAROM of BUE well with increased ROM in bilat elbows and hands. Pt continues to be limited by pain in L shoulder and limited to about 20* gentle passive forward flexion. Spent increased time educating and simulating proper shower positions for washing bilat underarms, pt verbalized understanding of compensatory strategies. Pt continues to benefit from continued acutely services to progress function in ADLs and exercise. D/c plan remains appropriate.   Follow Up Recommendations  SNF;Supervision - Intermittent    Equipment Recommendations  Other (comment);Tub/shower bench;Tub/shower seat       Precautions / Restrictions Precautions Precautions: Shoulder Type of Shoulder Precautions: reverse total shoulder Shoulder Interventions: Shoulder sling/immobilizer;Off for dressing/bathing/exercises Precaution Booklet Issued: Yes (comment) Precaution Comments: R shoulder: immobilization, NWB, shoulder sling.    L shoulder: reverse TSA, NWB, shoulder sling.  L wrist: cock up splint NWB.       Per MD: "appropriate for only activities of daily living and 2 pounds lifting with both right and left upper extremities."     AROM of bilat hands, R wrist, and bilat elbows; L shoulder pendulums Required Braces or Orthoses: Sling;Splint/Cast Splint/Cast: L wrist cock up; bilat slings Restrictions Weight Bearing Restrictions: Yes RUE Weight Bearing: Non  weight bearing LUE Weight Bearing: Non weight bearing       Mobility Bed Mobility Overal bed mobility: Needs Assistance             General bed mobility comments: pt sitting in chair upon arrival    Transfers Overall transfer level: Modified independent Equipment used: None                  Balance Overall balance assessment: Modified Independent Sitting-balance support: Feet supported Sitting balance-Leahy Scale: Normal     Standing balance support: No upper extremity supported;During functional activity Standing balance-Leahy Scale: Good        ADL either performed or assessed with clinical judgement   ADL Overall ADL's : Needs assistance/impaired Eating/Feeding: Sitting;Set up Eating/Feeding Details (indicate cue type and reason): requires set up for all packages and cutting of food, tray table set very close to pt with all available food placed as close as possible to her; pt reports she ate both breakfast and lunhc by herself today         Functional mobility during ADLs: Min guard General ADL Comments: session focused on BUE exercises, edema control, positioning adn education               Cognition Arousal/Alertness: Awake/alert Behavior During Therapy: WFL for tasks assessed/performed Overall Cognitive Status: Within Functional Limits for tasks assessed           General Comments: increased anxiety with movement        Exercises Exercises: Shoulder Shoulder Exercises Pendulum Exercise: 10 reps;Seated Shoulder Flexion: PROM;10 reps;Right Elbow Flexion: AAROM;10 reps;Seated;Both Elbow Extension: AAROM;10 reps;Seated;Both Wrist Flexion: AAROM;Right;10 reps;Seated Wrist Extension: AAROM;Right;10 reps;Seated Digit Composite Flexion:  AAROM;10 reps;Seated;Both Composite Extension: AAROM;Seated;Both Other Exercises Other Exercises: reviews gentle massage for edema   Shoulder Instructions Shoulder Instructions Method for sponge  bathing under operated UE: Patient able to independently direct caregiver Donning/doffing sling/immobilizer: Patient able to independently direct caregiver Correct positioning of sling/immobilizer: Patient able to independently direct caregiver Pendulum exercises (written home exercise program): Patient able to independently direct caregiver ROM for elbow, wrist and digits of operated UE: Minimal assistance Sling wearing schedule (on at all times/off for ADL's): Patient able to independently direct caregiver Positioning of UE while sleeping: Patient able to independently direct caregiver     General Comments No new skin integrity issues noted, pt tolerating exercises well, verbalized understanding for compensatory techniques for bathing    Pertinent Vitals/ Pain       Pain Assessment: Faces Faces Pain Scale: Hurts little more Pain Location: L shoulder towards the front Pain Descriptors / Indicators: Grimacing;Guarding;Sore Pain Intervention(s): Limited activity within patient's tolerance;Monitored during session;Patient requesting pain meds-RN notified  Prior Functioning/Environment    Frequency  Min 3X/week        Progress Toward Goals  OT Goals(current goals can now be found in the care plan section)  Progress towards OT goals: Progressing toward goals  Acute Rehab OT Goals Patient Stated Goal: return to independence OT Goal Formulation: With patient Time For Goal Achievement: 10/11/20 Potential to Achieve Goals: Fair ADL Goals Pt Will Perform Eating: with set-up;with adaptive utensils;sitting Pt Will Transfer to Toilet: Independently;ambulating Additional ADL Goal #1: Pt will indep complete HEP for L shoulder  Plan Discharge plan remains appropriate       AM-PAC OT "6 Clicks" Daily Activity     Outcome Measure   Help from another person eating meals?: A Little Help from another person taking care of personal grooming?: A Lot Help from another person toileting,  which includes using toliet, bedpan, or urinal?: A Lot Help from another person bathing (including washing, rinsing, drying)?: Total Help from another person to put on and taking off regular upper body clothing?: Total Help from another person to put on and taking off regular lower body clothing?: Total 6 Click Score: 10    End of Session    OT Visit Diagnosis: Muscle weakness (generalized) (M62.81);History of falling (Z91.81);Pain Pain - Right/Left: Left Pain - part of body: Shoulder;Arm   Activity Tolerance Patient tolerated treatment well   Patient Left in chair;with call bell/phone within reach   Nurse Communication Mobility status;Other (comment) (proper positioning for shower/bathing)        Time: 7517-0017 OT Time Calculation (min): 30 min  Charges: OT General Charges $OT Visit: 1 Visit OT Treatments $Therapeutic Exercise: 23-37 mins     Kainoa Swoboda A Zuhair Lariccia 10/02/2020, 2:24 PM

## 2020-10-02 NOTE — Progress Notes (Signed)
Physical Therapy Treatment Patient Details Name: Abigail Luna MRN: [id number] DOB: November 22, 1955 Today's Date: 10/02/2020    History of Present Illness Pt is 65 yo female who sustained ground level fall last week and found to have bil proximal humerus fx and L distal radius fx.  She was admitted on 09/26/20 for L reverse total shoulder.  L wrist and R humerus are being managed with immobilization.  Pt with medical hx including back pain, anxiety, spinal stenosis.    PT Comments    Pt is up to walk with PT after mult attempts to get her to walk and noted her ease in standing from the chair.  Pt is encouraged to let nursing staff assist her to walk, and told her that the nurses were concerned for her safety over the limited UE response to losing her balance.  She is instructed to do ROM to ankles and quad sets, glut sets to increase standing balance control.  Follow along with her to increase her safety and prepare for home.   Follow Up Recommendations  Supervision/Assistance - 24 hour;SNF     Equipment Recommendations  None recommended by PT    Recommendations for Other Services       Precautions / Restrictions Precautions Precautions: Shoulder Type of Shoulder Precautions: reverse total shoulder Shoulder Interventions: Shoulder sling/immobilizer;Off for dressing/bathing/exercises Precaution Booklet Issued: Yes (comment) Precaution Comments: R shoulder: immobilization, NWB, shoulder sling.    L shoulder: reverse TSA, NWB, shoulder sling.  L wrist: cock up splint NWB.       Per MD: "appropriate for only activities of daily living and 2 pounds lifting with both right and left upper extremities."     AROM of bilat hands, R wrist, and bilat elbows; L shoulder pendulums Required Braces or Orthoses: Sling;Splint/Cast Splint/Cast: L wrist cock up; bilat slings Restrictions Weight Bearing Restrictions: Yes RUE Weight Bearing: Non weight bearing LUE Weight Bearing: Non weight bearing     Mobility  Bed Mobility               General bed mobility comments: in chair    Transfers Overall transfer level: Modified independent Equipment used: None Transfers: Sit to/from Stand Sit to Stand: Modified independent (Device/Increase time)         General transfer comment: extra time and scooting out on chair  Ambulation/Gait Ambulation/Gait assistance: Supervision Gait Distance (Feet): 200 Feet Assistive device: None Gait Pattern/deviations: Wide base of support;Step-through pattern;Decreased stride length Gait velocity: decreased Gait velocity interpretation: <1.31 ft/sec, indicative of household ambulator General Gait Details: taking good paced walk with longer trip encouraged and asked pt to ask nursing to walk her   Stairs             Wheelchair Mobility    Modified Rankin (Stroke Patients Only)       Balance Overall balance assessment: Modified Independent Sitting-balance support: Feet supported Sitting balance-Leahy Scale: Normal       Standing balance-Leahy Scale: Good                              Cognition Arousal/Alertness: Awake/alert Behavior During Therapy: WFL for tasks assessed/performed Overall Cognitive Status: Within Functional Limits for tasks assessed                                        Exercises  General Comments        Pertinent Vitals/Pain Pain Assessment: Faces Faces Pain Scale: Hurts even more Pain Location: L shoulder Pain Descriptors / Indicators: Grimacing;Guarding Pain Intervention(s): Limited activity within patient's tolerance;Monitored during session;Premedicated before session;Repositioned    Home Living                      Prior Function            PT Goals (current goals can now be found in the care plan section) Progress towards PT goals: Progressing toward goals    Frequency    Min 3X/week      PT Plan Current plan remains  appropriate    Co-evaluation              AM-PAC PT "6 Clicks" Mobility   Outcome Measure  Help needed turning from your back to your side while in a flat bed without using bedrails?: A Little Help needed moving from lying on your back to sitting on the side of a flat bed without using bedrails?: A Little Help needed moving to and from a bed to a chair (including a wheelchair)?: A Little Help needed standing up from a chair using your arms (e.g., wheelchair or bedside chair)?: A Little Help needed to walk in hospital room?: A Little Help needed climbing 3-5 steps with a railing? : A Little 6 Click Score: 18    End of Session Equipment Utilized During Treatment: Gait belt Activity Tolerance: Patient tolerated treatment well Patient left: in chair;with call bell/phone within reach;with chair alarm set;with family/visitor present Nurse Communication: Mobility status PT Visit Diagnosis: Unsteadiness on feet (R26.81)     Time: 5631-4970 PT Time Calculation (min) (ACUTE ONLY): 16 min  Charges:  $Gait Training: 8-22 mins               Ivar Drape 10/02/2020, 7:50 PM Samul Dada, PT MS Acute Rehab Dept. Number: [facility] [phone] and [facility] [phone]

## 2020-10-03 DIAGNOSIS — N179 Acute kidney failure, unspecified: Secondary | ICD-10-CM | POA: Diagnosis not present

## 2020-10-03 LAB — CBC WITH DIFFERENTIAL/PLATELET
Abs Immature Granulocytes: 0.02 10*3/uL (ref 0.00–0.07)
Basophils Absolute: 0 10*3/uL (ref 0.0–0.1)
Basophils Relative: 1 %
Eosinophils Absolute: 0.1 10*3/uL (ref 0.0–0.5)
Eosinophils Relative: 3 %
HCT: 28.2 % — ABNORMAL LOW (ref 36.0–46.0)
Hemoglobin: 9.7 g/dL — ABNORMAL LOW (ref 12.0–15.0)
Immature Granulocytes: 1 %
Lymphocytes Relative: 32 %
Lymphs Abs: 1.4 10*3/uL (ref 0.7–4.0)
MCH: 34.3 pg — ABNORMAL HIGH (ref 26.0–34.0)
MCHC: 34.4 g/dL (ref 30.0–36.0)
MCV: 99.6 fL (ref 80.0–100.0)
Monocytes Absolute: 0.6 10*3/uL (ref 0.1–1.0)
Monocytes Relative: 14 %
Neutro Abs: 2.1 10*3/uL (ref 1.7–7.7)
Neutrophils Relative %: 49 %
Platelets: UNDETERMINED 10*3/uL (ref 150–400)
RBC: 2.83 MIL/uL — ABNORMAL LOW (ref 3.87–5.11)
RDW: 15.5 % (ref 11.5–15.5)
WBC: 4.3 10*3/uL (ref 4.0–10.5)
nRBC: 0 % (ref 0.0–0.2)

## 2020-10-03 LAB — COMPREHENSIVE METABOLIC PANEL
ALT: 33 U/L (ref 0–44)
AST: 57 U/L — ABNORMAL HIGH (ref 15–41)
Albumin: 2.7 g/dL — ABNORMAL LOW (ref 3.5–5.0)
Alkaline Phosphatase: 129 U/L — ABNORMAL HIGH (ref 38–126)
Anion gap: 7 (ref 5–15)
BUN: 10 mg/dL (ref 8–23)
CO2: 21 mmol/L — ABNORMAL LOW (ref 22–32)
Calcium: 10.1 mg/dL (ref 8.9–10.3)
Chloride: 107 mmol/L (ref 98–111)
Creatinine, Ser: 0.54 mg/dL (ref 0.44–1.00)
GFR, Estimated: >60 mL/min (ref >60)
Glucose, Bld: 153 mg/dL — ABNORMAL HIGH (ref 70–99)
Potassium: 4.4 mmol/L (ref 3.5–5.1)
Sodium: 135 mmol/L (ref 135–145)
Total Bilirubin: 2.1 mg/dL — ABNORMAL HIGH (ref 0.3–1.2)
Total Protein: 5.2 g/dL — ABNORMAL LOW (ref 6.5–8.1)

## 2020-10-03 LAB — PROTIME-INR
INR: 1.3 — ABNORMAL HIGH (ref 0.8–1.2)
Prothrombin Time: 15.9 seconds — ABNORMAL HIGH (ref 11.4–15.2)

## 2020-10-03 LAB — CREATININE, SERUM
Creatinine, Ser: 3.67 mg/dL — ABNORMAL HIGH (ref 0.44–1.00)
GFR, Estimated: 13 mL/min — ABNORMAL LOW (ref >60)

## 2020-10-03 LAB — RESP PANEL BY RT-PCR (FLU A&B, COVID) ARPGX2
Influenza A by PCR: NEGATIVE
Influenza B by PCR: NEGATIVE
SARS Coronavirus 2 by RT PCR: NEGATIVE

## 2020-10-03 LAB — FIBRINOGEN: Fibrinogen: 323 mg/dL (ref 210–475)

## 2020-10-03 LAB — TECHNOLOGIST SMEAR REVIEW

## 2020-10-03 LAB — CK: Total CK: 33 U/L — ABNORMAL LOW (ref 38–234)

## 2020-10-03 MED ORDER — COVID-19 MRNA VAC-TRIS(PFIZER) 30 MCG/0.3ML IM SUSP
0.3000 mL | Freq: Once | INTRAMUSCULAR | Status: AC
  Administered 2020-10-03: 0.3 mL via INTRAMUSCULAR
  Filled 2020-10-03: qty 0.3

## 2020-10-03 MED ORDER — HEPARIN SODIUM (PORCINE) 5000 UNIT/ML IJ SOLN
5000.0000 [IU] | Freq: Three times a day (TID) | INTRAMUSCULAR | Status: DC
  Administered 2020-10-04 (×2): 5000 [IU] via SUBCUTANEOUS
  Filled 2020-10-03 (×2): qty 1

## 2020-10-03 MED ORDER — LACTATED RINGERS IV SOLN: INTRAVENOUS | Status: DC

## 2020-10-03 MED ORDER — SODIUM CHLORIDE 0.9 % IV SOLN: INTRAVENOUS | Status: DC

## 2020-10-03 NOTE — Discharge Instructions (Signed)
-   Lifting restrictions :     For the left arm though will be no lifting more than 2 pounds from the level of the hand to the elbow.  You will be able to do passive range of motion up to 90 degrees of forward flexion with the left shoulder.  You can move your elbow and fingers as much as he would like.  Your wrist will stay in the removable wrist brace unless showering, changing, or doing hand hygiene.  You should pump your fingers and move your elbow is much as you can.  You can come out of the sling to work on this.  No overhead motion or lifting at this time.  For the right arm she can move the elbow, wrist, and fingers as much as she would like with no lifting more than 2 pounds.  She should wear the sling when walking and at night.  You can unstrap the slings when resting with pillow supporting Under your arms.  No overhead motion or lifting at this time  -For pain you should take ibuprofen and Tylenol  every 8 hours as needed for pain as directed on the bottle.  For breakthrough pain use oxycodone.   -You will take aspirin 81 mg twice a day for the next 6 weeks for blood clot prevention. -You can take shower and leave your bandage on the left shoulder.  This bandage is waterproof.  You should maintain this bandage until follow-up in the office 2 weeks after surgery.  No submerging the left shoulder underwater.  -Call the office for any questions or concerns.  Follow-up at [facility] with Dr. Aundria Rud or Dion Saucier 2 weeks after your surgery (around 10/10/20) .[phone]

## 2020-10-03 NOTE — Plan of Care (Signed)
  Problem: Activity: Goal: Ability to avoid complications of mobility impairment will improve Outcome: Progressing Goal: Ability to tolerate increased activity will improve Outcome: Progressing   Problem: Education: Goal: Verbalization of understanding the information provided will improve Outcome: Progressing   Problem: Coping: Goal: Level of anxiety will decrease Outcome: Progressing   Problem: Physical Regulation: Goal: Postoperative complications will be avoided or minimized Outcome: Progressing   Problem: Respiratory: Goal: Ability to maintain a clear airway will improve Outcome: Progressing   

## 2020-10-03 NOTE — Progress Notes (Signed)
Patient's serum creatinine returned significantly elevated concerning for AKI. Patient started on NS, discontinued lovenox, and will begin subcutaneous heparin three times a day while inpatient.  Dr. Aundria Rud is also consulting medicine to evaluate the patient. We will delay her discharge pending further evaluation.

## 2020-10-03 NOTE — Discharge Summary (Signed)
Patient ID: Abigail Luna MRN: [id number] DOB/AGE: 66-04-1956 66 y.o.  Admit date: 09/26/2020 Discharge date: 10/04/20  Primary Diagnosis: Closed fracture of the left proximal humerus, closed fracture of the right proximal humerus, closed fracture of the left distal radius Admission Diagnoses: Status post left reverse total shoulder arthroplasty for fracture Past Medical History:  Diagnosis Date  . Anxiety   . Back pain   . Complication of anesthesia   . GERD (gastroesophageal reflux disease)   . Hypertension   . PONV (postoperative nausea and vomiting)   . Pre-diabetes   . Spinal stenosis    Discharge Diagnoses:   Active Problems:   Closed fracture of left proximal humerus   Estimated body mass index is 41.57 kg/m as calculated from the following:   Height as of this encounter:  (1.549 m).   Weight as of this encounter: 99.8 kg.  Procedure:  Procedure(s) (LRB): REVERSE SHOULDER ARTHROPLASTY (Left)   Consults:  Hospitalist service  HPI: Abigail Luna is a 65 year old female that presented to the hospital for surgery following a ground-level fall that occurred on 09/18/2020.  This resulted in a left proximal humerus fracture that was indicated for left reverse total shoulder arthroplasty.  She also sustained a right proximal humerus fracture and a left distal radius fracture that were appropriate for nonoperative treatment.  Patient underwent a left reverse total shoulder arthroplasty on 09/26/2020.  She admitted for postoperative pain control, rehabilitation, and postoperative care due to significant requirements for assistance with bilateral upper extremity injuries. Laboratory Data: Admission on 09/26/2020  Component Date Value Ref Range Status  . Sodium 09/26/2020 133* 135 - 145 mmol/L Final  . Potassium 09/26/2020 4.7  3.5 - 5.1 mmol/L Final  . Chloride 09/26/2020 103  98 - 111 mmol/L Final  . CO2 09/26/2020 22  22 - 32 mmol/L Final  . Glucose, Bld 09/26/2020 136* 70 - 99  mg/dL Final   Glucose reference range applies only to samples taken after fasting for at least 8 hours.  . BUN 09/26/2020 18  8 - 23 mg/dL Final  . Creatinine, Ser 09/26/2020 0.67  0.44 - 1.00 mg/dL Final  . Calcium 40/98/1191 9.9  8.9 - 10.3 mg/dL Final  . GFR, Estimated 09/26/2020 >60  >60 mL/min Final   Comment: (NOTE) Calculated using the CKD-EPI Creatinine Equation (2021)   . Anion gap 09/26/2020 8  5 - 15 Final   Performed at [facility], [address] [place], [place] [address]  . WBC 09/26/2020 7.3  4.0 - 10.5 K/uL Final  . RBC 09/26/2020 3.27* 3.87 - 5.11 MIL/uL Final  . Hemoglobin 09/26/2020 11.2* 12.0 - 15.0 g/dL Final  . HCT 56/21/3086 32.4* 36.0 - 46.0 % Final  . MCV 09/26/2020 99.1  80.0 - 100.0 fL Final  . MCH 09/26/2020 34.3* 26.0 - 34.0 pg Final  . MCHC 09/26/2020 34.6  30.0 - 36.0 g/dL Final  . RDW 57/84/6962 14.4  11.5 - 15.5 % Final  . Platelets 09/26/2020 DCLMP  150 - 400 K/uL Final   Comment: Immature Platelet Fraction may be clinically indicated, consider ordering this additional test [id number] REPEATED TO VERIFY PLATELET CLUMPING, SUGGEST RECOLLECTION OF SAMPLE IN CITRATE TUBE. PLATELETS APPEAR DECREASED   . nRBC 09/26/2020 0.0  0.0 - 0.2 % Final   Performed at [facility], [address] [place], [place] [address]  . WBC 09/27/2020 5.4  4.0 - 10.5 K/uL Final  . RBC 09/27/2020 2.65* 3.87 - 5.11 MIL/uL  Final  . Hemoglobin 09/27/2020 9.1* 12.0 - 15.0 g/dL Final  . HCT 16/10/960405/18/2022 25.7* 36.0 - 46.0 % Final  . MCV 09/27/2020 97.0  80.0 - 100.0 fL Final  . MCH 09/27/2020 34.3* 26.0 - 34.0 pg Final  . MCHC 09/27/2020 35.4  30.0 - 36.0 g/dL Final  . RDW 54/09/811905/18/2022 14.0  11.5 - 15.5 % Final  . Platelets 09/27/2020 83* 150 - 400 K/uL Final   Comment: Immature Platelet Fraction may be clinically indicated, consider ordering this additional test [id number] REPEATED TO VERIFY PLATELET COUNT CONFIRMED BY SMEAR   . nRBC 09/27/2020 0.0  0.0 - 0.2 %  Final   Performed at [facility], [address] [place], [place] [address]  . Sodium 09/27/2020 131* 135 - 145 mmol/L Final  . Potassium 09/27/2020 4.7  3.5 - 5.1 mmol/L Final  . Chloride 09/27/2020 106  98 - 111 mmol/L Final  . CO2 09/27/2020 20* 22 - 32 mmol/L Final  . Glucose, Bld 09/27/2020 190* 70 - 99 mg/dL Final   Glucose reference range applies only to samples taken after fasting for at least 8 hours.  . BUN 09/27/2020 18  8 - 23 mg/dL Final  . Creatinine, Ser 09/27/2020 0.66  0.44 - 1.00 mg/dL Final  . Calcium 65/78/469605/18/2022 9.5  8.9 - 10.3 mg/dL Final  . GFR, Estimated 09/27/2020 >60  >60 mL/min Final   Comment: (NOTE) Calculated using the CKD-EPI Creatinine Equation (2021)   . Anion gap 09/27/2020 5  5 - 15 Final   Performed at [facility], [address] [place], [place] [address]  . WBC 09/28/2020 11.3* 4.0 - 10.5 K/uL Final  . RBC 09/28/2020 2.79* 3.87 - 5.11 MIL/uL Final  . Hemoglobin 09/28/2020 9.6* 12.0 - 15.0 g/dL Final   REPEATED TO VERIFY  . HCT 09/28/2020 26.7* 36.0 - 46.0 % Final  . MCV 09/28/2020 95.7  80.0 - 100.0 fL Final  . MCH 09/28/2020 34.4* 26.0 - 34.0 pg Final  . MCHC 09/28/2020 36.0  30.0 - 36.0 g/dL Final  . RDW 41/32/440105/19/2022 14.6  11.5 - 15.5 % Final  . Platelets 09/28/2020 94* 150 - 400 K/uL Final   Comment: Immature Platelet Fraction may be clinically indicated, consider ordering this additional test [id number] CONSISTENT WITH PREVIOUS RESULT REPEATED TO VERIFY PLATELET COUNT CONFIRMED BY SMEAR   . nRBC 09/28/2020 0.0  0.0 - 0.2 % Final   Performed at [facility], [address] [place], [place] [address]  . Creatinine, Ser 10/03/2020 3.67* 0.44 - 1.00 mg/dL Final  . GFR, Estimated 10/03/2020 13* >60 mL/min Final   Comment: (NOTE) Calculated using the CKD-EPI Creatinine Equation (2021) Performed at [facility], [address] [place], [place] [address]   . SARS Coronavirus 2 by RT PCR 10/03/2020 NEGATIVE  NEGATIVE  Final   Comment: (NOTE) SARS-CoV-2 target nucleic acids are NOT DETECTED.  The SARS-CoV-2 RNA is generally detectable in upper respiratory specimens during the acute phase of infection. The lowest concentration of SARS-CoV-2 viral copies this assay can detect is 138 copies/mL. A negative result does not preclude SARS-Cov-2 infection and should not be used as the sole basis for treatment or other patient management decisions. A negative result may occur with  improper specimen collection/handling, submission of specimen other than nasopharyngeal swab, presence of viral mutation(s) within the areas targeted by this assay, and inadequate number of viral copies(<138 copies/mL). A negative result must be combined with clinical observations,  patient history, and epidemiological information. The expected result is Negative.  Fact Sheet for Patients:  BloggerCourse.com  Fact Sheet for Healthcare Providers:  SeriousBroker.it  This test is no                          t yet approved or cleared by the [place] FDA and  has been authorized for detection and/or diagnosis of SARS-CoV-2 by FDA under an Emergency Use Authorization (EUA). This EUA will remain  in effect (meaning this test can be used) for the duration of the COVID-19 declaration under Section 564(b)(1) of the Act, 21 U.S.C.section 360bbb-3(b)(1), unless the authorization is terminated  or revoked sooner.      . Influenza A by PCR 10/03/2020 NEGATIVE  NEGATIVE Final  . Influenza B by PCR 10/03/2020 NEGATIVE  NEGATIVE Final   Comment: (NOTE) The Xpert Xpress SARS-CoV-2/FLU/RSV plus assay is intended as an aid in the diagnosis of influenza from Nasopharyngeal swab specimens and should not be used as a sole basis for treatment. Nasal washings and aspirates are unacceptable for Xpert Xpress SARS-CoV-2/FLU/RSV testing.  Fact Sheet for  Patients: BloggerCourse.com  Fact Sheet for Healthcare Providers: SeriousBroker.it  This test is not yet approved or cleared by the [place] FDA and has been authorized for detection and/or diagnosis of SARS-CoV-2 by FDA under an Emergency Use Authorization (EUA). This EUA will remain in effect (meaning this test can be used) for the duration of the COVID-19 declaration under Section 564(b)(1) of the Act, 21 U.S.C. section 360bbb-3(b)(1), unless the authorization is terminated or revoked.  Performed at [facility], [address], [place], [place] [address]   . Tech Review 10/03/2020 MORPHOLOGY UNREMARKABLE   Final   Performed at [facility], [address] [place], [place] [address]  . WBC 10/03/2020 4.3  4.0 - 10.5 K/uL Final  . RBC 10/03/2020 2.83* 3.87 - 5.11 MIL/uL Final  . Hemoglobin 10/03/2020 9.7* 12.0 - 15.0 g/dL Final  . HCT 88/89/1694 28.2* 36.0 - 46.0 % Final  . MCV 10/03/2020 99.6  80.0 - 100.0 fL Final  . MCH 10/03/2020 34.3* 26.0 - 34.0 pg Final  . MCHC 10/03/2020 34.4  30.0 - 36.0 g/dL Final  . RDW 50/38/8828 15.5  11.5 - 15.5 % Final  . Platelets 10/03/2020 PLATELET CLUMPS NOTED ON SMEAR, UNABLE TO ESTIMATE  150 - 400 K/uL Final   Comment: Immature Platelet Fraction may be clinically indicated, consider ordering this additional test [id number]   . nRBC 10/03/2020 0.0  0.0 - 0.2 % Final  . Neutrophils Relative % 10/03/2020 49  % Final  . Neutro Abs 10/03/2020 2.1  1.7 - 7.7 K/uL Final  . Lymphocytes Relative 10/03/2020 32  % Final  . Lymphs Abs 10/03/2020 1.4  0.7 - 4.0 K/uL Final  . Monocytes Relative 10/03/2020 14  % Final  . Monocytes Absolute 10/03/2020 0.6  0.1 - 1.0 K/uL Final  . Eosinophils Relative 10/03/2020 3  % Final  . Eosinophils Absolute 10/03/2020 0.1  0.0 - 0.5 K/uL Final  . Basophils Relative 10/03/2020 1  % Final  . Basophils Absolute 10/03/2020 0.0  0.0 - 0.1 K/uL Final  .  Immature Granulocytes 10/03/2020 1  % Final  . Abs Immature Granulocytes 10/03/2020 0.02  0.00 - 0.07 K/uL Final   Performed at [facility], [address] [place], [place] [address]  . Total CK 10/03/2020 33* 38 - 234 U/L  Final   Performed at [facility], [address] [place], [place] [address]  . Sodium 10/03/2020 135  135 - 145 mmol/L Final  . Potassium 10/03/2020 4.4  3.5 - 5.1 mmol/L Final  . Chloride 10/03/2020 107  98 - 111 mmol/L Final  . CO2 10/03/2020 21* 22 - 32 mmol/L Final  . Glucose, Bld 10/03/2020 153* 70 - 99 mg/dL Final   Glucose reference range applies only to samples taken after fasting for at least 8 hours.  . BUN 10/03/2020 10  8 - 23 mg/dL Final  . Creatinine, Ser 10/03/2020 0.54  0.44 - 1.00 mg/dL Final   DELTA CHECK NOTED  . Calcium 10/03/2020 10.1  8.9 - 10.3 mg/dL Final  . Total Protein 10/03/2020 5.2* 6.5 - 8.1 g/dL Final  . Albumin 84/16/6063 2.7* 3.5 - 5.0 g/dL Final  . AST 01/60/1093 57* 15 - 41 U/L Final  . ALT 10/03/2020 33  0 - 44 U/L Final  . Alkaline Phosphatase 10/03/2020 129* 38 - 126 U/L Final  . Total Bilirubin 10/03/2020 2.1* 0.3 - 1.2 mg/dL Final  . GFR, Estimated 10/03/2020 >60  >60 mL/min Final   Comment: (NOTE) Calculated using the CKD-EPI Creatinine Equation (2021)   . Anion gap 10/03/2020 7  5 - 15 Final   Performed at [facility], [address], [place], [place] [address]  . Fibrinogen 10/03/2020 323  210 - 475 mg/dL Final   Performed at [facility], [address] [place], [place] [address]  . Prothrombin Time 10/03/2020 15.9* 11.4 - 15.2 seconds Final  . INR 10/03/2020 1.3* 0.8 - 1.2 Final   Comment: (NOTE) INR goal varies based on device and disease states. Performed at [facility], [address] [place], [place] [address]   Hospital Outpatient Visit on 09/25/2020  Component Date Value Ref Range Status  . SARS Coronavirus 2 09/25/2020 NEGATIVE  NEGATIVE Final   Comment:  (NOTE) SARS-CoV-2 target nucleic acids are NOT DETECTED.  The SARS-CoV-2 RNA is generally detectable in upper and lower respiratory specimens during the acute phase of infection. Negative results do not preclude SARS-CoV-2 infection, do not rule out co-infections with other pathogens, and should not be used as the sole basis for treatment or other patient management decisions. Negative results must be combined with clinical observations, patient history, and epidemiological information. The expected result is Negative.  Fact Sheet for Patients: HairSlick.no  Fact Sheet for Healthcare Providers: quierodirigir.com  This test is not yet approved or cleared by the [place] FDA and  has been authorized for detection and/or diagnosis of SARS-CoV-2 by FDA under an Emergency Use Authorization (EUA). This EUA will remain  in effect (meaning this test can be used) for the duration of the COVID-19 declaration under Se                          ction 564(b)(1) of the Act, 21 U.S.C. section 360bbb-3(b)(1), unless the authorization is terminated or revoked sooner.  Performed at [facility], [address] [place], [place] [address]      X-Rays:DG Shoulder Right  Result Date: 09/18/2020 CLINICAL DATA:  Pain following fall EXAM: RIGHT SHOULDER - 2+ VIEW COMPARISON:  None. FINDINGS: Frontal, oblique, and Y scapular images were obtained. There is a fracture through the proximal humeral metaphysis. There is avulsion of the greater tuberosity. No other fractures are evident. No dislocation. There is mild  generalized osteoarthritic change. Calcification is noted in the superior aspect of the acromioclavicular joint which may represent residua of prior trauma. Visualized right lung clear. IMPRESSION: Transversely oriented fracture proximal humeral metaphysis. Avulsion of the greater tuberosity. No dislocation. 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 Note that mild bony overgrowth along the acromioclavicular joint inferiorly may lead to increased risk of impingement syndrome. Electronically Signed   By: Bretta Bang III M.D.   On: 09/18/2020 15:09   CT Shoulder Left Wo Contrast  Result Date: 09/18/2020 CLINICAL DATA:  The patient suffered a left shoulder fracture in a fall today. Initial encounter. EXAM: CT OF THE UPPER LEFT EXTREMITY WITHOUT CONTRAST TECHNIQUE: Multidetector CT imaging of the upper left extremity was performed according to the standard protocol. COMPARISON:  Plain films left shoulder earlier today. FINDINGS: Bones/Joint/Cartilage As seen on the comparison plain films, the patient has an impacted surgical neck fracture of the humerus. There is fragment override medially of approximately 1.5 cm and approximately 1/2 shaft width anterior displacement of the shaft of the humerus. The fracture involves the greater tuberosity which is mildly comminuted. The greater tuberosity is superiorly displaced up to 1.2 cm. The lesser tuberosity is spared. No other fracture is identified. The humeral head is located and the acromioclavicular joint is intact. 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 The acromion is type 1. Ligaments Suboptimally assessed by CT. Muscles and Tendons As visualized by CT scan, the rotator cuff appears intact. No muscle atrophy is identified. Soft tissues Imaged lung parenchyma is clear with centrilobular emphysema noted. IMPRESSION: Impacted anterior lead displaced surgical neck fracture of the left humerus involves the greater tuberosity as described above. Electronically Signed   By: Drusilla Kanner M.D.   On: 09/18/2020 15:38   CT Shoulder Right Wo Contrast  Result Date: 09/18/2020 CLINICAL DATA:  Proximal right humeral fracture EXAM: CT OF THE UPPER RIGHT EXTREMITY WITHOUT CONTRAST TECHNIQUE: Multidetector CT imaging of the upper right extremity was performed  according to the standard protocol. COMPARISON:  X-ray 09/18/2020 FINDINGS: Bones/Joint/Cartilage Comminuted fracture of the proximal right humerus with minimally impacted fracture through the anatomic neck (series 6, image 51). There is also a transversely oriented component through the surgical neck. Mildly displaced greater tuberosity avulsion fracture. No fracture involvement of the lesser tuberosity. No evidence of articular surface involvement. Glenohumeral joint alignment is maintained without dislocation. Glenohumeral joint space maintained. Moderate arthropathy of the [facility] joint. Ligaments Suboptimally assessed by CT. Muscles and Tendons Preserved bulk of the rotator cuff musculature without atrophy or fatty infiltration. Rotator cuff osseous avulsion of the greater tuberosity. Soft tissues Mild soft tissue edema surrounds the fracture site. No organized fluid collection or hematoma. Visualized right lung field is clear. IMPRESSION: 1. Acute comminuted, mildly displaced fracture of the right humeral head and neck. 2. Rotator cuff osseous avulsion of the greater tuberosity. Electronically Signed   By: Duanne Guess D.O.   On: 09/18/2020 15:44   DG Shoulder Left  Result Date: 09/18/2020 CLINICAL DATA:  Fall, left arm pain, limited range of motion, initial encounter. EXAM: LEFT SHOULDER - 2+ VIEW COMPARISON:  None. FINDINGS: There is a comminuted fracture of the humeral head and neck with impaction and slight displacement of the greater tuberosity. A triangular lucency is seen along the superior aspect of the glenohumeral joint, suggesting a lipohemarthrosis. Difficult to exclude an associated fracture of the glenoid. Acromioclavicular joint is intact. Visualized left chest is grossly unremarkable. IMPRESSION: 1. Comminuted and impacted fracture of the humeral  head/neck with a probable associated lipohemarthrosis. 2. Difficult to exclude an associated fracture of the glenoid. Electronically Signed   By:  Leanna Battles M.D.   On: 09/18/2020 14:00   DG Shoulder Left Port  Result Date: 09/26/2020 CLINICAL DATA:  Postoperative evaluation. EXAM: LEFT SHOULDER COMPARISON:  Sep 18, 2020 FINDINGS: A left shoulder replacement is seen without evidence of surrounding lucency to suggest the presence of hardware loosening or infection. The acute fracture of the proximal left humerus seen on the prior study is again noted. There is no evidence of dislocation. Radiopaque skin staples are noted. Soft tissues are otherwise unremarkable. IMPRESSION: Status post left shoulder replacement without evidence of complication. Electronically Signed   By: Aram Candela M.D.   On: 09/26/2020 20:26     EKG: Orders placed or performed during the hospital encounter of 09/26/20  . EKG 12 lead per protocol  . EKG 12 lead per protocol     Hospital Course: Monserrate Blaschke is a 65 y.o. who was admitted to Hospital. They were brought to the operating room on 09/26/2020 and underwent Procedure(s): REVERSE SHOULDER ARTHROPLASTY.  Patient tolerated the procedure well and was later transferred to the recovery room and then to the orthopaedic floor for postoperative care.  They were given PO and IV analgesics for pain control following their surgery.  They were given 24 hours of postoperative antibiotics of  Anti-infectives (From admission, onward)   Start     Dose/Rate Route Frequency Ordered Stop   09/26/20 2230  ceFAZolin (ANCEF) IVPB 2g/100 mL premix        2 g 200 mL/hr over 30 Minutes Intravenous Every 6 hours 09/26/20 2057 09/27/20 1629   09/26/20 1741  vancomycin (VANCOCIN) powder  Status:  Discontinued          As needed 09/26/20 1742 09/26/20 1809   09/26/20 1330  ceFAZolin (ANCEF) IVPB 2g/100 mL premix        2 g 200 mL/hr over 30 Minutes Intravenous On call to O.R. 09/26/20 1245 09/26/20 1630   09/26/20 1253  ceFAZolin (ANCEF) 2-4 GM/100ML-% IVPB       Note to Pharmacy: Phebe Colla   : cabinet override       09/26/20 1253 09/26/20 1639     and started on DVT prophylaxis in the form of Lovenox.   PT and OT were ordered for total joint protocol.  Discharge planning consulted to help with postop disposition and equipment needs.  Patient had an uneventful night on the evening of surgery.  They started to get up OOB with therapy on day one.  Patient continued to work with OT and PT throughout her hospital stay.  After evaluations by PT and OT as well as discussions with the patient and her close friends it was determined that patient would benefit from admission to a skilled nursing facility until more independent.  COVID test was performed and a COVID booster was also administered in preparation for transfer to skilled nursing facility.  Patient was noted to have a high creatinine on a singular lab however patient was unaware of this lab being taken and hospitalist was called for further evaluation of elevated creatinine concerning for acute kidney injury.  The hospital service evaluated the patient and repeat labs were ordered.  On repeat labs these were noted to be within normal limits.  The initial elevate creatinine was noted to be a lab error.  At that time was noted that patient was medically stable for discharge  to skilled nursing facility.  For DVT prophylaxis she will be transitioned from Lovenox to aspirin  BID.   Diet: Regular diet Activity: Left Upper Extremity: Okay to begin gentle passive forward flexion ROM of the left shoulder up to 90 degrees as tolerated. Okay to move elbow and digits passive/active. No lifting more than 2 pounds with the LUE. Sling should be on except for exercises, showering, ADLs. Sling can be relaxed when awake with pillows below under elbows.  Maintain left removable wrist splint except for hygiene, ADLs.    Right Upper Extremity: Okay to move elbow and digits. No lifting more than 2 pounds with the RUE. Sling should be on except for exercises, showering, ADLs.    OK to  shower with assistance, bandage is waterproof, just need to remove slings and wrist splint. Place back on after shower.    Follow-up:in 2 weeks s/p surgery (around 10/10/20) Disposition - Skilled nursing facility Discharged Condition: good   Discharge Instructions    Call MD / Call 911   Complete by: As directed    If you experience chest pain or shortness of breath, CALL 911 and be transported to the hospital emergency room.  If you develope a fever above 101 F, pus (white drainage) or increased drainage or redness at the wound, or calf pain, call your surgeon's office.   Constipation Prevention   Complete by: As directed    Drink plenty of fluids.  Prune juice may be helpful.  You may use a stool softener, such as Colace (over the counter) 100 mg twice a day.  Use MiraLax (over the counter) for constipation as needed.   Diet - low sodium heart healthy   Complete by: As directed    Increase activity slowly as tolerated   Complete by: As directed    Post-operative opioid taper instructions:   Complete by: As directed    POST-OPERATIVE OPIOID TAPER INSTRUCTIONS: It is important to wean off of your opioid medication as soon as possible. If you do not need pain medication after your surgery it is ok to stop day one. Opioids include: Codeine, Hydrocodone(Norco, Vicodin), Oxycodone(Percocet, oxycontin) and hydromorphone amongst others.  Long term and even short term use of opiods can cause: Increased pain response Dependence Constipation Depression Respiratory depression And more.  Withdrawal symptoms can include Flu like symptoms Nausea, vomiting And more Techniques to manage these symptoms Hydrate well Eat regular healthy meals Stay active Use relaxation techniques(deep breathing, meditating, yoga) Do Not substitute Alcohol to help with tapering If you have been on opioids for less than two weeks and do not have pain than it is ok to stop all together.  Plan to wean off of  opioids This plan should start within one week post op of your joint replacement. Maintain the same interval or time between taking each dose and first decrease the dose.  Cut the total daily intake of opioids by one tablet each day Next start to increase the time between doses. The last dose that should be eliminated is the evening dose.        Allergies as of 10/04/2020      Reactions   Other    Tolerates Prednisone in small doses, in higher doses cause palpitations      Medication List    STOP taking these medications   morphine 15 MG tablet Commonly known as: MSIR     TAKE these medications   acetaminophen 500 MG tablet Commonly known as: TYLENOL Take  1,000 mg by mouth every 6 (six) hours as needed (pain).   aspirin EC 81 MG tablet Take 1 tablet (81 mg total) by mouth in the morning and at bedtime. Swallow whole.   ibuprofen 200 MG tablet Commonly known as: ADVIL Take 400-800 mg by mouth every 8 (eight) hours as needed (pain.).   ketotifen 0.025 % ophthalmic solution Commonly known as: ZADITOR Place 1 drop into both eyes 2 (two) times daily as needed (allergy/eye irritation.).   lisinopril 5 MG tablet Commonly known as: ZESTRIL Take 5 mg by mouth at bedtime.   methocarbamol 500 MG tablet Commonly known as: ROBAXIN Take 500 mg by mouth in the morning, at noon, and at bedtime. What changed: Another medication with the same name was added. Make sure you understand how and when to take each.   methocarbamol 500 MG tablet Commonly known as: Robaxin Take 1 tablet (500 mg total) by mouth every 8 (eight) hours as needed for muscle spasms. What changed: You were already taking a medication with the same name, and this prescription was added. Make sure you understand how and when to take each.   nystatin powder Generic drug: nystatin Apply 1 application topically 3 (three) times daily as needed (skin irritation/rash).   omeprazole 20 MG capsule Commonly known as:  PRILOSEC Take 20 mg by mouth daily as needed (acid reflux/indigestion).   oxyCODONE 5 MG immediate release tablet Commonly known as: Roxicodone Take 1 tablet (5 mg total) by mouth every 4 (four) hours as needed for up to 30 doses for severe pain. What changed: reasons to take this   polyethylene glycol 17 g packet Commonly known as: MIRALAX / GLYCOLAX Take 17 g by mouth daily as needed (constipation).        Contact information for follow-up providers    Yolonda Kida, MD In 2 weeks.   Specialty: Orthopedic Surgery Why: For wound re-check, For suture removal Contact information: [address] [place] [address] [phone]            Contact information for after-discharge care    Destination    [facility]erred [facility] .   Service: Skilled Nursing Contact information: [address] [place] [address] [phone]                  Signed: Dion Saucier PA-C Orthopaedic Surgery 10/04/2020, 12:15 PM

## 2020-10-03 NOTE — Progress Notes (Signed)
Subjective: Abigail Luna is a 65 year old female  days status post left reverse total shoulder arthroplasty for fracture performed on 09/26/20. She also has a left nondisplaced distal radius fracture being treated non-operatively and a right proximal humerus fracture being treated non-operatively.   Patient reports pain as mild to moderate. She reports she has been walking around. She has tried to decrease her pain medication. Reports her friend who spoke with SNF reports she will need a covid pfizer booster at the hospital or at the facility.    Objective:   VITALS:   Vitals:   10/02/20 0804 10/02/20 1334 10/03/20 0407 10/03/20 0731  BP: (!) 164/66 (!) 150/64 (!) 161/86 (!) 161/71  Pulse: 82 87 92 76  Resp: 17 17 16 16   Temp: 98 F (36.7 C) 98.1 F (36.7 C) 98.2 F (36.8 C) 97.6 F (36.4 C)  TempSrc: Oral Oral  Oral  SpO2: 100% 100% 100% 100%  Weight:      Height:       Constitutional: General Appearance: healthy-appearing, well-nourished, and well-developed.  Bilateral slings Level of Distress: no acute distress. Eyes: Lens (normal) clear: both eyes. Head: Head: normocephalic and atraumatic. Lungs: Respiratory effort: no dyspnea. Skin: Inspection and palpation: no rash, lesions Neurologic: Cranial Nerves: grossly intact. Sensation: grossly intact. Psychiatric: Insight: good judgement and insight. Mental Status: normal mood and affect and active and alert. Orientation: to time, place, and person.   Right Upper Extremity:  NV intact. Elbow ROM 0-125. Able to move all digits and wrist. Mild hand edema. No TTP of the right shoulder.   Left Upper Extremity:   INSPECTION & PALPATION: Aquacel dressing present to the left anterior shoulder with minimal drainage, edema of the hands.  Removable wrist brace present on the left wrist.   SENSORY: sensation is intact to light touch in:  superficial radial nerve distribution (dorsal first web space) median nerve distribution (tip of  index finger)   ulnar nerve distribution (tip of small finger)    Axillary nerve distribution (lateral shoulder)   ROM: passive elbow ROM 10-120.TTP wrist with palpation. . Able to move digits.   MOTOR:  + motor posterior interosseous nerve (thumb IP extension) + anterior interosseous nerve (thumb IP flexion, index finger DIP flexion) + radial nerve (wrist extension) + median nerve (palpable firing thenar mass) + ulnar nerve (palpable firing of first dorsal interosseous muscle)    VASCULAR: 2+ radial pulse, brisk capillary refill < 2 sec, fingers warm and well-perfused b/l    No calf pain bilaterally. Mild calf swelling.   Lab Results  Component Value Date   WBC 11.3 (H) 09/28/2020   HGB 9.6 (L) 09/28/2020   HCT 26.7 (L) 09/28/2020   MCV 95.7 09/28/2020   PLT 94 (L) 09/28/2020   BMET    Component Value Date/Time   NA 131 (L) 09/27/2020 0253   K 4.7 09/27/2020 0253   CL 106 09/27/2020 0253   CO2 20 (L) 09/27/2020 0253   GLUCOSE 190 (H) 09/27/2020 0253   BUN 18 09/27/2020 0253   CREATININE 3.67 (H) 10/03/2020 0214   CALCIUM 9.5 09/27/2020 0253   GFRNONAA 13 (L) 10/03/2020 0214     Assessment/Plan: 7 Days Post-Op   Active Problems:   Closed fracture of left proximal humerus   Advance diet Up with therapy   Weightbearing Status:  Left Upper Extremity: Okay to begin gentle passive forward flexion ROM of the left shoulder up to 90 degrees as tolerated. Okay to move elbow and  digits passive/active. No lifting more than 2 pounds with the LUE. Sling should be on except for exercises, showering, ADLs. Sling can be relaxed when awake with pillows below under elbows.  Maintain left removable wrist splint except for hygiene, ADLs.   Right Upper Extremity: Okay to move elbow and digits. No lifting more than 2 pounds with the RUE. Sling should be on except for exercises, showering, ADLs.   OK to shower with assistance, bandage is waterproof, just need to remove slings and  wrist splint. Place back on after shower.     DVT Prophylaxis: Lovenox inpatient, Aspirin upon discharge  Per [facility] team insurance authorization has been approved at [facility]. Discharge medications in the patients chart printed. Patient reports she needs a covid booster Pfizer shot in the hospital or at the SNF per snf policy. Booster ordered, ok to administer to Right Deltoid. Discharge orders placed for leave today if possible following negative covid test. If not will d/c tomorrow.    Patient will continue therapy while in the hospital.  She will be discharged to a skilled nursing facility and will follow up with [facility] around 10/10/20 with Gildo Crisco or Dr. Aundria Rud.    [name] 10/03/2020, 2:01 PM  Dion Saucier PA-C  Physician Assistant with Dr. Rebekah [place] [facility]

## 2020-10-03 NOTE — Consult Note (Signed)
[facility] Initial Consultation Note   Patient Name: Abigail Luna    NTZ:[id number] PCP: Paulina Fusi, MD     DOB: 27-Feb-1956  DOA: 09/26/2020 DOS: the patient was seen and examined on 10/03/2020   Referring physician: Yolonda Kida, MD  Reason for consult: Acute kidney injury  HPI: Rozlyn Yerby is a 65 y.o. female with Past medical history of anxiety, back pain, bipolar disease, HTN, GERD, obesity. Patient is coming from Home Patient had a fall last week which resulted in fractures of her right and left proximal humerus.  Along with that she also had a distal radius fracture on the left.  Patient initially presented on 5/17 for left sided arthroplasty.  Underwent left reverse total shoulder arthroplasty.  Tolerated procedure very well.  Pain was still present as well controlled.  Her right upper extremity where she continues to have fracture is currently in sling.  Patient denies any acute complaint of chest pain, abdominal pain, nausea, vomiting, diarrhea, constipation. She is unable to drink a lot of water due to bilateral sling and also prefer not to drink a lot of water at nighttime with the concern as getting out of the bed and is significantly painful. She has been continued on her lisinopril as her home medication.  Not on any diuretic. Denies any burning urination, blood in the urine, incomplete bladder emptying. She has bilateral leg swelling which is chronic for her and has worsened in the hospital as she is unable to mobilize.  No leg tenderness. Her blood pressure has been fluctuating significantly to the lower side during this hospital stay. She reports that she has fatty liver disease history but is not aware of thrombocytopenia.  No complaint of fever chills cough or bleeding. Patient tells me that her IV in the left leg was removed on Saturday and since then she has not had any blood work as she will require a foot stick for her blood work. I called the lab and  the sample has her name and MRN.  Review of Systems: as mentioned in the history of present illness.  All other systems reviewed and are negative.  Past Medical History:  Diagnosis Date  . Anxiety   . Back pain   . Complication of anesthesia   . GERD (gastroesophageal reflux disease)   . Hypertension   . PONV (postoperative nausea and vomiting)   . Pre-diabetes   . Spinal stenosis    Past Surgical History:  Procedure Laterality Date  . COLONOSCOPY    . REVERSE SHOULDER ARTHROPLASTY Left 09/26/2020   Procedure: REVERSE SHOULDER ARTHROPLASTY;  Surgeon: Yolonda Kida, MD;  Location: [facility] OR;  Service: Orthopedics;  Laterality: Left;  2.5 HRS   Social History:  reports that she has never smoked. She has never used smokeless tobacco. She reports previous alcohol use. She reports previous drug use.  Allergies  Allergen Reactions  . Other     Tolerates Prednisone in small doses, in higher doses cause palpitations   Family history reviewed and not pertinent No significant family history of renal disease.  Prior to Admission medications   Medication Sig Start Date End Date Taking? Authorizing Provider  acetaminophen (TYLENOL) 500 MG tablet Take 1,000 mg by mouth every 6 (six) hours as needed (pain).   Yes [provider]  aspirin EC 81 MG tablet Take 1 tablet (81 mg total) by mouth in the morning and at bedtime. Swallow whole. 09/29/20 11/08/20 Yes McClung, Kevan D, PA  ibuprofen (  ADVIL) 200 MG tablet Take 400-800 mg by mouth every 8 (eight) hours as needed (pain.).   Yes [provider]  ketotifen (ZADITOR) 0.025 % ophthalmic solution Place 1 drop into both eyes 2 (two) times daily as needed (allergy/eye irritation.).   Yes [provider]  lisinopril (ZESTRIL) 5 MG tablet Take 5 mg by mouth at bedtime. 07/23/20  Yes [provider]  methocarbamol (ROBAXIN) 500 MG tablet Take 500 mg by mouth in the morning, at noon, and at bedtime.   Yes [provider]  methocarbamol (ROBAXIN) 500 MG tablet Take 1 tablet (500 mg total) by mouth every 8 (eight) hours as needed for muscle spasms. 09/29/20  Yes McClung, Kevan D, PA  omeprazole (PRILOSEC) 20 MG capsule Take 20 mg by mouth daily as needed (acid reflux/indigestion). 07/23/20  Yes [provider]  oxyCODONE (OXY IR/ROXICODONE) 5 MG immediate release tablet Take 5 mg by mouth every 4 (four) hours as needed (pain).   Yes [provider]  oxyCODONE (ROXICODONE) 5 MG immediate release tablet Take 1 tablet (5 mg total) by mouth every 4 (four) hours as needed for up to 30 doses for severe pain. 09/29/20  Yes McClung, Kevan D, PA  polyethylene glycol (MIRALAX / GLYCOLAX) 17 g packet Take 17 g by mouth daily as needed (constipation).   Yes [provider]  morphine (MSIR) 15 MG tablet Take 0.5 tablets (7.5 mg total) by mouth every 4 (four) hours as needed for severe pain. Patient not taking: Reported on 09/25/2020 09/18/20   Melene Plan, DO  NYSTATIN powder Apply 1 application topically 3 (three) times daily as needed (skin irritation/rash). 08/12/20   [provider]    Physical Exam: Vitals:   10/02/20 1334 10/03/20 0407 10/03/20 0731 10/03/20 1614  BP: (!) 150/64 (!) 161/86 (!) 161/71 (!) 150/70  Pulse: 87 92 76 84  Resp: 17 16 16 17   Temp: 98.1 F (36.7 C) 98.2 F (36.8 C) 97.6 F (36.4 C) 98.2 F (36.8 C)  TempSrc: Oral  Oral Oral  SpO2: 100% 100% 100% 100%  Weight:      Height:       General: alert and oriented to time, place, and person. Appear in mild distress, affect appropriate Eyes: PERRL, Conjunctiva normal ENT: Oral Mucosa Clear, moist  Neck: no JVD, no Abnormal Mass Or lumps Cardiovascular: S1 and S2 Present, no Murmur, peripheral pulses symmetrical Respiratory: good respiratory effort, Bilateral Air entry equal and Decreased, no signs of accessory muscle use, Clear to Auscultation, no Crackles, no wheezes Abdomen: Bowel Sound present, Soft  and no tenderness, no hernia Skin: no rashes  Extremities: bilateral  Pedal edema, no calf tenderness Neurologic: without any new focal findings Gait not checked due to patient safety concerns  Labs:  CBC: Recent Labs  Lab 09/27/20 0253 09/28/20 0441  WBC 5.4 11.3*  HGB 9.1* 9.6*  HCT 25.7* 26.7*  MCV 97.0 95.7  PLT 83* 94*   Basic Metabolic Panel: Recent Labs  Lab 09/27/20 0253 10/03/20 0214  NA 131*  --   K 4.7  --   CL 106  --   CO2 20*  --   GLUCOSE 190*  --   BUN 18  --   CREATININE 0.66 3.67*  CALCIUM 9.5  --    Liver Function Tests: No results for input(s): AST, ALT, ALKPHOS, BILITOT, PROT, ALBUMIN in the last 168 hours. No results for input(s): LIPASE, AMYLASE in the last 168 hours. No results for  input(s): AMMONIA in the last 168 hours.  Cardiac Enzymes: No results for input(s): CKTOTAL, CKMB, CKMBINDEX, TROPONINI in the last 168 hours. No results for input(s): PROBNP in the last 8760 hours.  CBG: No results for input(s): GLUCAP in the last 168 hours.  Radiological Exams: No results found.  Assessment/Plan Active Problems:   Closed fracture of left proximal humerus  1.  Acute kidney injury. Not clear since the patient is claiming that she actually did not have any blood draws this morning. We will recheck the labs to verify and ensure. Also check bladder scan. Will continue with IV LR for her although monitor for volume overload given her leg swelling. Recommend pure wick overnight since she is worried about pain while mobilizing out of the bed. Suspect this is most likely prerenal in etiology but his renal function continues to remain elevated recommend ultrasound renal as well.  2.  Thrombocytopenia. Etiology not clear. In the setting of acute kidney injury this might represent a possibility of hemolytic process. Although the patient does not have any symptoms or signs of hemolytic process. Will check INR, fibrinogen, evaluate with a smear  review for further work-up. This might be most likely associated with her liver disease.  3.  Essential hypertension. Blood pressure rather elevated. Patient is on lisinopril which will be on hold given acute kidney injury. Change to hydralazine.  4.  Bilateral lower extremity edema. Suspect this is secondary to hypertension and immobility causing dependent edema. Patient does not have any evidence of DVT right now. Recommend Unna boots given the significant tightness.  Family Communication: No family at bedside. Primary team communication: Discussed with Dr. Aundria Rud.  Thank you very much for involving [place] in care of your patient.  We will continue to follow the patient.   Author: Lynden Oxford, MD [facility] 10/03/2020 6:13 PM    If 7PM-7AM, please contact night-coverage [email or web address]

## 2020-10-03 NOTE — Progress Notes (Signed)
Patient refusing IV start.

## 2020-10-03 NOTE — Progress Notes (Signed)
Physical Therapy Treatment Patient Details Name: Abigail Luna MRN: [id number] DOB: 01-27-1956 Today's Date: 10/03/2020    History of Present Illness Pt is 65 yo female who sustained ground level fall last week and found to have bil proximal humerus fx and L distal radius fx.  She was admitted on 09/26/20 for L reverse total shoulder.  L wrist and R humerus are being managed with immobilization.  Pt with medical hx including back pain, anxiety, spinal stenosis.    PT Comments    Pt was able to progress gait further down the hallway today, but continues to have small staggers requiring min guard assist for safety.  She was unable to preform peri care without total assist after tolieting.  She participated in seated LE exercise program.  PT will continue to follow acutely for safe mobility progression.   Follow Up Recommendations  SNF     Equipment Recommendations  None recommended by PT    Recommendations for Other Services       Precautions / Restrictions Precautions Precautions: Shoulder Type of Shoulder Precautions: reverse total shoulder Shoulder Interventions: Shoulder sling/immobilizer;Off for dressing/bathing/exercises Required Braces or Orthoses: Sling;Splint/Cast Splint/Cast: L wrist cock up; bilat slings Restrictions RUE Weight Bearing: Non weight bearing LUE Weight Bearing: Non weight bearing Other Position/Activity Restrictions: Left Upper Extremity: Okay to begin gentle passive forward flexion ROM of the left shoulder up to 90 degrees as tolerated. Okay to move elbow and digits passive/active. No lifting more than 2 pounds with the LUE. Sling should be on except for exercises, showering, ADLs. Sling can be relaxed when awake with pillows below under elbows.  Maintain left removable wrist splint except for hygiene, ADLs.      Right Upper Extremity: Okay to move elbow and digits. No lifting more than 2 pounds with the RUE. Sling should be on except for exercises, showering,  ADLs.    Mobility  Bed Mobility Overal bed mobility: Needs Assistance Bed Mobility: Sit to Supine       Sit to supine: Supervision   General bed mobility comments: Pt needed assistance holding her proping pillows in place while she transitioned herself back into bed.  HOB maximally elevated.    Transfers Overall transfer level: Needs assistance Equipment used: None Transfers: Sit to/from Stand Sit to Stand: Supervision         General transfer comment: supervision for safety and to ensure pt has her balance.  Ambulation/Gait Ambulation/Gait assistance: Min guard Gait Distance (Feet): 500 Feet Assistive device: None Gait Pattern/deviations: Step-through pattern;Staggering right;Staggering left Gait velocity: decreased Gait velocity interpretation: 1.31 - 2.62 ft/sec, indicative of limited community ambulator General Gait Details: Pt with mildly staggering gait pattern, cues to mind doorways.   Stairs             Wheelchair Mobility    Modified Rankin (Stroke Patients Only)       Balance Overall balance assessment: Needs assistance Sitting-balance support: Feet supported;No upper extremity supported Sitting balance-Leahy Scale: Good     Standing balance support: No upper extremity supported Standing balance-Leahy Scale: Good                              Cognition Arousal/Alertness: Awake/alert Behavior During Therapy: WFL for tasks assessed/performed Overall Cognitive Status: Within Functional Limits for tasks assessed  Exercises General Exercises - Lower Extremity Ankle Circles/Pumps: AROM;Both;10 reps;Seated Long Arc Quad: AROM;Both;10 reps;Seated    General Comments General comments (skin integrity, edema, etc.): total assist for peri care after having a BM      Pertinent Vitals/Pain Pain Assessment: Faces Faces Pain Scale: Hurts little more Pain Location: bil  shoulders Pain Descriptors / Indicators: Grimacing;Guarding Pain Intervention(s): Limited activity within patient's tolerance;Monitored during session;Repositioned    Home Living                      Prior Function            PT Goals (current goals can now be found in the care plan section) Progress towards PT goals: Progressing toward goals    Frequency    Min 3X/week      PT Plan Current plan remains appropriate    Co-evaluation              AM-PAC PT "6 Clicks" Mobility   Outcome Measure  Help needed turning from your back to your side while in a flat bed without using bedrails?: A Little Help needed moving from lying on your back to sitting on the side of a flat bed without using bedrails?: A Little Help needed moving to and from a bed to a chair (including a wheelchair)?: A Little Help needed standing up from a chair using your arms (e.g., wheelchair or bedside chair)?: A Little Help needed to walk in hospital room?: A Little Help needed climbing 3-5 steps with a railing? : A Little 6 Click Score: 18    End of Session Equipment Utilized During Treatment:  (bil shoulder slings and L wrist splint) Activity Tolerance: Patient tolerated treatment well Patient left: in chair   PT Visit Diagnosis: Unsteadiness on feet (R26.81)     Time: 0973-5329 PT Time Calculation (min) (ACUTE ONLY): 39 min  Charges:  $Gait Training: 23-37 mins $Therapeutic Activity: 8-22 mins                     Corinna Capra, PT, DPT  Acute Rehabilitation [phone] pager [phone]) [phone] office

## 2020-10-03 NOTE — Progress Notes (Signed)
Pt refused PIV; pt stated, "You can not stick my arms or hands.. both my shoulders are broken".

## 2020-10-03 NOTE — Progress Notes (Signed)
Occupational Therapy Treatment Patient Details Name: Abigail Luna MRN: [id number] DOB: 1955-11-30 Today's Date: 10/03/2020    History of present illness Pt is 65 yo female who sustained ground level fall last week and found to have bil proximal humerus fx and L distal radius fx.  She was admitted on 09/26/20 for L reverse total shoulder.  L wrist and R humerus are being managed with immobilization.  Pt with medical hx including back pain, anxiety, spinal stenosis.   OT comments  Abigail Luna is doing well, with reports of less pain and better body mobility this session. Pt completed all functional ambulation, room distances at mod I level. She continues to require total A for toilet hygiene due to bilat shoulder fractures and ROM restrictions. Pt tolerated all BUE well noted with increased ROM and less pain with movement. Pt continues to benefit from acute OT to progress function in BUE. D/c plan remains appropriate.     Follow Up Recommendations  SNF;Supervision - Intermittent    Equipment Recommendations  Other (comment);Tub/shower bench;Tub/shower seat       Precautions / Restrictions Precautions Precautions: Shoulder Type of Shoulder Precautions: reverse total shoulder Shoulder Interventions: Shoulder sling/immobilizer;Off for dressing/bathing/exercises Precaution Booklet Issued: Yes (comment) Precaution Comments: R shoulder: immobilization, NWB, shoulder sling.    L shoulder: reverse TSA, NWB, shoulder sling.  L wrist: cock up splint NWB.       Per MD: "appropriate for only activities of daily living and 2 pounds lifting with both right and left upper extremities."     AROM of bilat hands, R wrist, and bilat elbows; L shoulder pendulums Required Braces or Orthoses: Sling;Splint/Cast Splint/Cast: L wrist cock up; bilat slings Restrictions Weight Bearing Restrictions: Yes RUE Weight Bearing: Non weight bearing LUE Weight Bearing: Non weight bearing Other Position/Activity Restrictions: Left  Upper Extremity: Okay to begin gentle passive forward flexion ROM of the left shoulder up to 90 degrees as tolerated. Okay to move elbow and digits passive/active. No lifting more than 2 pounds with the LUE. Sling should be on except for exercises, showering, ADLs. Sling can be relaxed when awake with pillows below under elbows.  Maintain left removable wrist splint except for hygiene, ADLs.      Right Upper Extremity: Okay to move elbow and digits. No lifting more than 2 pounds with the RUE. Sling should be on except for exercises, showering, ADLs.       Mobility Bed Mobility Overal bed mobility: Needs Assistance             General bed mobility comments: in chair upon arrival    Transfers Overall transfer level: Modified independent Equipment used: None Transfers: Sit to/from Stand Sit to Stand: Modified independent (Device/Increase time)              Balance Overall balance assessment: Modified Independent Sitting-balance support: Feet supported Sitting balance-Leahy Scale: Normal     Standing balance support: No upper extremity supported;During functional activity Standing balance-Leahy Scale: Good              ADL either performed or assessed with clinical judgement   ADL Overall ADL's : Needs assistance/impaired   Toilet Transfer: Modified Independent;Comfort height toilet;Ambulation   Toileting- Clothing Manipulation and Hygiene: Total assistance;Sit to/from stand       Functional mobility during ADLs: Modified independent General ADL Comments: pt completed toileting needs, continues to be limited by bilat UE fractures and ROM restrictions  Cognition Arousal/Alertness: Awake/alert Behavior During Therapy: WFL for tasks assessed/performed Overall Cognitive Status: Within Functional Limits for tasks assessed         General Comments: pt anxious about covid booster shot        Exercises Shoulder Exercises Pendulum Exercise:  Left;10 reps;Standing Shoulder Flexion: PROM;10 reps;Right Elbow Flexion: AAROM;10 reps;Seated;Both Elbow Extension: AAROM;10 reps;Seated;Both Wrist Flexion: AAROM;Right;10 reps;Seated Wrist Extension: AAROM;Right;10 reps;Seated Digit Composite Flexion: AAROM;10 reps;Seated;Both Composite Extension: AAROM;Seated;Both   Shoulder Instructions Shoulder Instructions Donning/doffing sling/immobilizer: Patient able to independently direct caregiver Correct positioning of sling/immobilizer: Patient able to independently direct caregiver Pendulum exercises (written home exercise program): Patient able to independently direct caregiver ROM for elbow, wrist and digits of operated UE: Minimal assistance Sling wearing schedule (on at all times/off for ADL's): Patient able to independently direct caregiver Positioning of UE while sleeping: Patient able to independently direct caregiver     General Comments no new issues noted, pain well managed today    Pertinent Vitals/ Pain       Pain Assessment: Faces Faces Pain Scale: Hurts a little bit Pain Location: L shoulder Pain Descriptors / Indicators: Grimacing;Guarding Pain Intervention(s): Limited activity within patient's tolerance;Monitored during session         Frequency  Min 3X/week        Progress Toward Goals  OT Goals(current goals can now be found in the care plan section)  Progress towards OT goals: Progressing toward goals  Acute Rehab OT Goals Patient Stated Goal: return to independence OT Goal Formulation: With patient Time For Goal Achievement: 10/11/20 Potential to Achieve Goals: Fair ADL Goals Pt Will Perform Eating: with set-up;with adaptive utensils;sitting Pt Will Transfer to Toilet: Independently;ambulating Additional ADL Goal #1: Pt will indep complete HEP for L shoulder  Plan Discharge plan remains appropriate       AM-PAC OT "6 Clicks" Daily Activity     Outcome Measure   Help from another person  eating meals?: A Little Help from another person taking care of personal grooming?: A Lot Help from another person toileting, which includes using toliet, bedpan, or urinal?: A Lot Help from another person bathing (including washing, rinsing, drying)?: Total Help from another person to put on and taking off regular upper body clothing?: Total Help from another person to put on and taking off regular lower body clothing?: Total 6 Click Score: 10    End of Session    OT Visit Diagnosis: Muscle weakness (generalized) (M62.81);History of falling (Z91.81);Pain Pain - Right/Left: Left Pain - part of body: Shoulder;Arm   Activity Tolerance Patient tolerated treatment well   Patient Left in chair;with call bell/phone within reach   Nurse Communication Mobility status        Time: 1856-3149 OT Time Calculation (min): 33 min  Charges: OT General Charges $OT Visit: 1 Visit OT Treatments $Self Care/Home Management : 8-22 mins $Therapeutic Exercise: 23-37 mins     Kendel Bessey A Eveleigh Crumpler 10/03/2020, 3:03 PM

## 2020-10-03 NOTE — TOC Progression Note (Addendum)
Transition of Care [facility]) - Progression Note    Patient Details  Name: Abigail Luna MRN: [id number] Date of Birth: 1955/09/20  [facility]) CM/SW Contact  Ralene Bathe, LCSWA Phone Number: 10/03/2020, 12:12 PM  Clinical Narrative:    CSW spoke with French Ana at [employer] and was informed that the insurance has approved SNF placement for the patient.    Pending negative COVID test results.    CSW received a call from Orchard with [facility] informing CSW that they do not have any COVID isolation beds available and that they patient was accepted because the patient agreed to receive the COVID booster, but the patient has not received it.  CSW received a call from Amy, [place].  Amy informed CSW that the patient has agreed to receive the booster.  CSW contacted French Ana and informed her of this information.  The bed is still available for patient.  CSW received information that the patient's creatine levels are now elevated and that there may be a delay in d/c.  CSW informed the SNF of this information.    Expected Discharge Plan: Skilled Nursing Facility Barriers to Discharge: SNF Pending bed offer,Insurance Authorization,Continued Medical Work up  Expected Discharge Plan and Services Expected Discharge Plan: Skilled Nursing Facility       Living arrangements for the past 2 months: Single Family Home                                       Social Determinants of Health (SDOH) Interventions    Readmission Risk Interventions No flowsheet data found.

## 2020-10-04 DIAGNOSIS — N179 Acute kidney failure, unspecified: Secondary | ICD-10-CM | POA: Diagnosis not present

## 2020-10-04 DIAGNOSIS — S52502D Unspecified fracture of the lower end of left radius, subsequent encounter for closed fracture with routine healing: Secondary | ICD-10-CM | POA: Diagnosis not present

## 2020-10-04 DIAGNOSIS — S42291A Other displaced fracture of upper end of right humerus, initial encounter for closed fracture: Secondary | ICD-10-CM | POA: Diagnosis not present

## 2020-10-04 DIAGNOSIS — S52502A Unspecified fracture of the lower end of left radius, initial encounter for closed fracture: Secondary | ICD-10-CM | POA: Diagnosis not present

## 2020-10-04 DIAGNOSIS — W19XXXA Unspecified fall, initial encounter: Secondary | ICD-10-CM | POA: Diagnosis not present

## 2020-10-04 DIAGNOSIS — S52515D Nondisplaced fracture of left radial styloid process, subsequent encounter for closed fracture with routine healing: Secondary | ICD-10-CM | POA: Diagnosis not present

## 2020-10-04 DIAGNOSIS — M545 Low back pain, unspecified: Secondary | ICD-10-CM | POA: Diagnosis not present

## 2020-10-04 DIAGNOSIS — E889 Metabolic disorder, unspecified: Secondary | ICD-10-CM | POA: Diagnosis not present

## 2020-10-04 DIAGNOSIS — B354 Tinea corporis: Secondary | ICD-10-CM | POA: Diagnosis not present

## 2020-10-04 DIAGNOSIS — S42202A Unspecified fracture of upper end of left humerus, initial encounter for closed fracture: Secondary | ICD-10-CM | POA: Diagnosis not present

## 2020-10-04 DIAGNOSIS — S42201D Unspecified fracture of upper end of right humerus, subsequent encounter for fracture with routine healing: Secondary | ICD-10-CM | POA: Diagnosis not present

## 2020-10-04 DIAGNOSIS — R2681 Unsteadiness on feet: Secondary | ICD-10-CM | POA: Diagnosis not present

## 2020-10-04 DIAGNOSIS — S42202D Unspecified fracture of upper end of left humerus, subsequent encounter for fracture with routine healing: Secondary | ICD-10-CM | POA: Diagnosis not present

## 2020-10-04 DIAGNOSIS — M25519 Pain in unspecified shoulder: Secondary | ICD-10-CM | POA: Diagnosis not present

## 2020-10-04 DIAGNOSIS — S52515S Nondisplaced fracture of left radial styloid process, sequela: Secondary | ICD-10-CM | POA: Diagnosis not present

## 2020-10-04 DIAGNOSIS — M5459 Other low back pain: Secondary | ICD-10-CM | POA: Diagnosis not present

## 2020-10-04 DIAGNOSIS — E46 Unspecified protein-calorie malnutrition: Secondary | ICD-10-CM | POA: Diagnosis not present

## 2020-10-04 DIAGNOSIS — F419 Anxiety disorder, unspecified: Secondary | ICD-10-CM | POA: Diagnosis not present

## 2020-10-04 DIAGNOSIS — I469 Cardiac arrest, cause unspecified: Secondary | ICD-10-CM | POA: Diagnosis not present

## 2020-10-04 DIAGNOSIS — Z79899 Other long term (current) drug therapy: Secondary | ICD-10-CM | POA: Diagnosis not present

## 2020-10-04 DIAGNOSIS — M25532 Pain in left wrist: Secondary | ICD-10-CM | POA: Diagnosis not present

## 2020-10-04 DIAGNOSIS — S42202S Unspecified fracture of upper end of left humerus, sequela: Secondary | ICD-10-CM | POA: Diagnosis not present

## 2020-10-04 DIAGNOSIS — Z4889 Encounter for other specified surgical aftercare: Secondary | ICD-10-CM | POA: Diagnosis not present

## 2020-10-04 DIAGNOSIS — S42301D Unspecified fracture of shaft of humerus, right arm, subsequent encounter for fracture with routine healing: Secondary | ICD-10-CM | POA: Diagnosis not present

## 2020-10-04 DIAGNOSIS — I10 Essential (primary) hypertension: Secondary | ICD-10-CM | POA: Diagnosis not present

## 2020-10-04 DIAGNOSIS — S42301S Unspecified fracture of shaft of humerus, right arm, sequela: Secondary | ICD-10-CM | POA: Diagnosis not present

## 2020-10-04 DIAGNOSIS — K219 Gastro-esophageal reflux disease without esophagitis: Secondary | ICD-10-CM | POA: Diagnosis not present

## 2020-10-04 MED ORDER — LISINOPRIL 5 MG PO TABS: 5.0000 mg | ORAL_TABLET | Freq: Every day | ORAL | Status: DC

## 2020-10-04 NOTE — Progress Notes (Signed)
Subjective: 65 year old female with medical history of anxiety, back pain, bipolar disease, hypertension, GERD, obesity came from home after she had fallen at home resulted in fracture of right and left proximal humerus.  During hospital stay patient was found to have creatinine of 3.47 So hospitalist was consulted for acute kidney injury. Repeat labs obtained yesterday showed that value of 3.47 was a lab error as creatinine on repeat labs were 0.54.   Vitals:   10/03/20 0731 10/03/20 1614  BP: (!) 161/71 (!) 150/70  Pulse: 76 84  Resp: 16 17  Temp: 97.6 F (36.4 C) 98.2 F (36.8 C)  SpO2: 100% 100%    General-appears in no acute distress Heart-S1-S2, regular, no murmur auscultated Lungs-clear to auscultation bilaterally, no wheezing or crackles auscultated Abdomen-soft, nontender, no organomegaly Extremities-no edema in the lower extremities Neuro-alert, oriented x3, no focal deficit noted  A/P  Lab error-creatinine of 3.47 was lab error, repeat labs showed normal creatinine of 0.54.  Patient does not have acute kidney injury.  No further work-up required.  She can be started back on lisinopril 5 mg daily.  Thrombocytopenia-chronic patient has chronic thrombocytopenia, she has history of fatty liver disease.  She will follow-up with PCP as outpatient for further work-up to check for liver cirrhosis.   Meredeth Ide [facility] [phone]

## 2020-10-04 NOTE — TOC Transition Note (Signed)
Transition of Care [facility]) - CM/SW Discharge Note   Patient Details  Name: Abigail Luna MRN: [id number] Date of Birth: June 04, 1955  Transition of Care [facility]) CM/SW Contact:  Ralene Bathe, LCSWA Phone Number: 10/04/2020, 1:08 PM   Clinical Narrative:     Patient will DC to: [facility] [place] Anticipated DC date: 10/04/2020 Family notified: YES Transport by: Sharin Mons   Per MD patient ready for DC to SNF. RN to call report prior to discharge [phone]. RN, patient, patient's family, and facility notified of DC. Discharge Summary and FL2 sent to facility. DC packet on chart. Ambulance transport requested for patient.   CSW will sign off for now as social work intervention is no longer needed. Please consult [place] again if new needs arise.    Final next level of care: Skilled Nursing Facility Barriers to Discharge: Barriers Resolved   Patient Goals and CMS Choice Patient states their goals for this hospitalization and ongoing recovery are:: To only be at the SNF for 1 week and go home. CMS Medicare.gov Compare Post Acute Care list provided to:: Patient Choice offered to / list presented to : Patient  Discharge Placement              Patient chooses bed at: [facility] [place] Patient to be transferred to facility by: [facility] Name of family member notified: [email or web address] (Friend)   [phone] Patient and family notified of of transfer: 10/04/20  Discharge Plan and Services                                     Social Determinants of Health (SDOH) Interventions     Readmission Risk Interventions No flowsheet data found.

## 2020-10-04 NOTE — TOC Progression Note (Signed)
Transition of Care [facility]) - Progression Note    Patient Details  Name: Abigail Luna MRN: [id number] Date of Birth: October 18, 1955  Transition of Care [facility]) CM/SW Contact  Erin Sons, [place] Phone Number: 10/04/2020, 9:45 AM  Clinical Narrative:     CSW called French Ana with [facility] and notified that pt did receive her covid booster. French Ana confirmed that if pt is medically ready she can be admitted today once d/c summary is in.   Expected Discharge Plan: Skilled Nursing Facility Barriers to Discharge: SNF Pending bed offer,Insurance Authorization,Continued Medical Work up  Expected Discharge Plan and Services Expected Discharge Plan: Skilled Nursing Facility       Living arrangements for the past 2 months: Single Family Home Expected Discharge Date: 10/04/20                                     Social Determinants of Health (SDOH) Interventions    Readmission Risk Interventions No flowsheet data found.

## 2020-10-08 DIAGNOSIS — S42202A Unspecified fracture of upper end of left humerus, initial encounter for closed fracture: Secondary | ICD-10-CM | POA: Diagnosis not present

## 2020-10-08 DIAGNOSIS — S52502A Unspecified fracture of the lower end of left radius, initial encounter for closed fracture: Secondary | ICD-10-CM | POA: Diagnosis not present

## 2020-10-08 DIAGNOSIS — M545 Low back pain, unspecified: Secondary | ICD-10-CM | POA: Diagnosis not present

## 2020-10-08 DIAGNOSIS — B354 Tinea corporis: Secondary | ICD-10-CM | POA: Diagnosis not present

## 2020-10-08 DIAGNOSIS — S42291A Other displaced fracture of upper end of right humerus, initial encounter for closed fracture: Secondary | ICD-10-CM | POA: Diagnosis not present

## 2020-10-08 DIAGNOSIS — E889 Metabolic disorder, unspecified: Secondary | ICD-10-CM | POA: Diagnosis not present

## 2020-10-08 DIAGNOSIS — K219 Gastro-esophageal reflux disease without esophagitis: Secondary | ICD-10-CM | POA: Diagnosis not present

## 2020-10-08 DIAGNOSIS — E46 Unspecified protein-calorie malnutrition: Secondary | ICD-10-CM | POA: Diagnosis not present

## 2020-10-12 DIAGNOSIS — S42301D Unspecified fracture of shaft of humerus, right arm, subsequent encounter for fracture with routine healing: Secondary | ICD-10-CM | POA: Diagnosis not present

## 2020-10-12 DIAGNOSIS — S52515S Nondisplaced fracture of left radial styloid process, sequela: Secondary | ICD-10-CM | POA: Diagnosis not present

## 2020-10-12 DIAGNOSIS — S42202S Unspecified fracture of upper end of left humerus, sequela: Secondary | ICD-10-CM | POA: Diagnosis not present

## 2020-10-12 DIAGNOSIS — S42301S Unspecified fracture of shaft of humerus, right arm, sequela: Secondary | ICD-10-CM | POA: Diagnosis not present

## 2020-10-12 DIAGNOSIS — S42202D Unspecified fracture of upper end of left humerus, subsequent encounter for fracture with routine healing: Secondary | ICD-10-CM | POA: Diagnosis not present

## 2020-10-12 DIAGNOSIS — S52515D Nondisplaced fracture of left radial styloid process, subsequent encounter for closed fracture with routine healing: Secondary | ICD-10-CM | POA: Diagnosis not present

## 2020-10-19 DIAGNOSIS — B372 Candidiasis of skin and nail: Secondary | ICD-10-CM | POA: Diagnosis not present

## 2020-10-19 DIAGNOSIS — R600 Localized edema: Secondary | ICD-10-CM | POA: Diagnosis not present

## 2020-10-20 DIAGNOSIS — S42202D Unspecified fracture of upper end of left humerus, subsequent encounter for fracture with routine healing: Secondary | ICD-10-CM | POA: Diagnosis not present

## 2020-10-20 DIAGNOSIS — F419 Anxiety disorder, unspecified: Secondary | ICD-10-CM | POA: Diagnosis not present

## 2020-10-20 DIAGNOSIS — M545 Low back pain, unspecified: Secondary | ICD-10-CM | POA: Diagnosis not present

## 2020-10-20 DIAGNOSIS — S42201D Unspecified fracture of upper end of right humerus, subsequent encounter for fracture with routine healing: Secondary | ICD-10-CM | POA: Diagnosis not present

## 2020-10-20 DIAGNOSIS — S52502D Unspecified fracture of the lower end of left radius, subsequent encounter for closed fracture with routine healing: Secondary | ICD-10-CM | POA: Diagnosis not present

## 2020-10-20 DIAGNOSIS — G8929 Other chronic pain: Secondary | ICD-10-CM | POA: Diagnosis not present

## 2020-10-20 DIAGNOSIS — M48061 Spinal stenosis, lumbar region without neurogenic claudication: Secondary | ICD-10-CM | POA: Diagnosis not present

## 2020-10-20 DIAGNOSIS — K219 Gastro-esophageal reflux disease without esophagitis: Secondary | ICD-10-CM | POA: Diagnosis not present

## 2020-10-20 DIAGNOSIS — I10 Essential (primary) hypertension: Secondary | ICD-10-CM | POA: Diagnosis not present

## 2020-10-23 DIAGNOSIS — M545 Low back pain, unspecified: Secondary | ICD-10-CM | POA: Diagnosis not present

## 2020-10-23 DIAGNOSIS — K219 Gastro-esophageal reflux disease without esophagitis: Secondary | ICD-10-CM | POA: Diagnosis not present

## 2020-10-23 DIAGNOSIS — S42201D Unspecified fracture of upper end of right humerus, subsequent encounter for fracture with routine healing: Secondary | ICD-10-CM | POA: Diagnosis not present

## 2020-10-23 DIAGNOSIS — G8929 Other chronic pain: Secondary | ICD-10-CM | POA: Diagnosis not present

## 2020-10-23 DIAGNOSIS — S52502D Unspecified fracture of the lower end of left radius, subsequent encounter for closed fracture with routine healing: Secondary | ICD-10-CM | POA: Diagnosis not present

## 2020-10-23 DIAGNOSIS — S42202D Unspecified fracture of upper end of left humerus, subsequent encounter for fracture with routine healing: Secondary | ICD-10-CM | POA: Diagnosis not present

## 2020-10-23 DIAGNOSIS — M48061 Spinal stenosis, lumbar region without neurogenic claudication: Secondary | ICD-10-CM | POA: Diagnosis not present

## 2020-10-23 DIAGNOSIS — F419 Anxiety disorder, unspecified: Secondary | ICD-10-CM | POA: Diagnosis not present

## 2020-10-23 DIAGNOSIS — I10 Essential (primary) hypertension: Secondary | ICD-10-CM | POA: Diagnosis not present

## 2020-10-24 DIAGNOSIS — R600 Localized edema: Secondary | ICD-10-CM | POA: Diagnosis not present

## 2020-10-24 DIAGNOSIS — S42201A Unspecified fracture of upper end of right humerus, initial encounter for closed fracture: Secondary | ICD-10-CM | POA: Diagnosis not present

## 2020-10-24 DIAGNOSIS — S52502A Unspecified fracture of the lower end of left radius, initial encounter for closed fracture: Secondary | ICD-10-CM | POA: Diagnosis not present

## 2020-10-24 DIAGNOSIS — S42202A Unspecified fracture of upper end of left humerus, initial encounter for closed fracture: Secondary | ICD-10-CM | POA: Diagnosis not present

## 2020-10-24 DIAGNOSIS — B372 Candidiasis of skin and nail: Secondary | ICD-10-CM | POA: Diagnosis not present

## 2020-10-25 DIAGNOSIS — S52502D Unspecified fracture of the lower end of left radius, subsequent encounter for closed fracture with routine healing: Secondary | ICD-10-CM | POA: Diagnosis not present

## 2020-10-25 DIAGNOSIS — G8929 Other chronic pain: Secondary | ICD-10-CM | POA: Diagnosis not present

## 2020-10-25 DIAGNOSIS — M48061 Spinal stenosis, lumbar region without neurogenic claudication: Secondary | ICD-10-CM | POA: Diagnosis not present

## 2020-10-25 DIAGNOSIS — M545 Low back pain, unspecified: Secondary | ICD-10-CM | POA: Diagnosis not present

## 2020-10-25 DIAGNOSIS — S42202D Unspecified fracture of upper end of left humerus, subsequent encounter for fracture with routine healing: Secondary | ICD-10-CM | POA: Diagnosis not present

## 2020-10-25 DIAGNOSIS — F419 Anxiety disorder, unspecified: Secondary | ICD-10-CM | POA: Diagnosis not present

## 2020-10-25 DIAGNOSIS — K219 Gastro-esophageal reflux disease without esophagitis: Secondary | ICD-10-CM | POA: Diagnosis not present

## 2020-10-25 DIAGNOSIS — I10 Essential (primary) hypertension: Secondary | ICD-10-CM | POA: Diagnosis not present

## 2020-10-25 DIAGNOSIS — S42201D Unspecified fracture of upper end of right humerus, subsequent encounter for fracture with routine healing: Secondary | ICD-10-CM | POA: Diagnosis not present

## 2020-10-27 DIAGNOSIS — M48061 Spinal stenosis, lumbar region without neurogenic claudication: Secondary | ICD-10-CM | POA: Diagnosis not present

## 2020-10-27 DIAGNOSIS — S42202D Unspecified fracture of upper end of left humerus, subsequent encounter for fracture with routine healing: Secondary | ICD-10-CM | POA: Diagnosis not present

## 2020-10-27 DIAGNOSIS — I10 Essential (primary) hypertension: Secondary | ICD-10-CM | POA: Diagnosis not present

## 2020-10-27 DIAGNOSIS — G8929 Other chronic pain: Secondary | ICD-10-CM | POA: Diagnosis not present

## 2020-10-27 DIAGNOSIS — S42201D Unspecified fracture of upper end of right humerus, subsequent encounter for fracture with routine healing: Secondary | ICD-10-CM | POA: Diagnosis not present

## 2020-10-27 DIAGNOSIS — K219 Gastro-esophageal reflux disease without esophagitis: Secondary | ICD-10-CM | POA: Diagnosis not present

## 2020-10-27 DIAGNOSIS — M545 Low back pain, unspecified: Secondary | ICD-10-CM | POA: Diagnosis not present

## 2020-10-27 DIAGNOSIS — F419 Anxiety disorder, unspecified: Secondary | ICD-10-CM | POA: Diagnosis not present

## 2020-10-27 DIAGNOSIS — S52502D Unspecified fracture of the lower end of left radius, subsequent encounter for closed fracture with routine healing: Secondary | ICD-10-CM | POA: Diagnosis not present

## 2020-10-31 DIAGNOSIS — F419 Anxiety disorder, unspecified: Secondary | ICD-10-CM | POA: Diagnosis not present

## 2020-10-31 DIAGNOSIS — M545 Low back pain, unspecified: Secondary | ICD-10-CM | POA: Diagnosis not present

## 2020-10-31 DIAGNOSIS — M48061 Spinal stenosis, lumbar region without neurogenic claudication: Secondary | ICD-10-CM | POA: Diagnosis not present

## 2020-10-31 DIAGNOSIS — K219 Gastro-esophageal reflux disease without esophagitis: Secondary | ICD-10-CM | POA: Diagnosis not present

## 2020-10-31 DIAGNOSIS — S42201D Unspecified fracture of upper end of right humerus, subsequent encounter for fracture with routine healing: Secondary | ICD-10-CM | POA: Diagnosis not present

## 2020-10-31 DIAGNOSIS — G8929 Other chronic pain: Secondary | ICD-10-CM | POA: Diagnosis not present

## 2020-10-31 DIAGNOSIS — S42202D Unspecified fracture of upper end of left humerus, subsequent encounter for fracture with routine healing: Secondary | ICD-10-CM | POA: Diagnosis not present

## 2020-10-31 DIAGNOSIS — S52502D Unspecified fracture of the lower end of left radius, subsequent encounter for closed fracture with routine healing: Secondary | ICD-10-CM | POA: Diagnosis not present

## 2020-10-31 DIAGNOSIS — I10 Essential (primary) hypertension: Secondary | ICD-10-CM | POA: Diagnosis not present

## 2020-11-02 DIAGNOSIS — S42301A Unspecified fracture of shaft of humerus, right arm, initial encounter for closed fracture: Secondary | ICD-10-CM | POA: Diagnosis not present

## 2020-11-02 DIAGNOSIS — S52515A Nondisplaced fracture of left radial styloid process, initial encounter for closed fracture: Secondary | ICD-10-CM | POA: Diagnosis not present

## 2020-11-02 DIAGNOSIS — Z4789 Encounter for other orthopedic aftercare: Secondary | ICD-10-CM | POA: Diagnosis not present

## 2020-11-03 DIAGNOSIS — M545 Low back pain, unspecified: Secondary | ICD-10-CM | POA: Diagnosis not present

## 2020-11-03 DIAGNOSIS — M48061 Spinal stenosis, lumbar region without neurogenic claudication: Secondary | ICD-10-CM | POA: Diagnosis not present

## 2020-11-03 DIAGNOSIS — S52502D Unspecified fracture of the lower end of left radius, subsequent encounter for closed fracture with routine healing: Secondary | ICD-10-CM | POA: Diagnosis not present

## 2020-11-03 DIAGNOSIS — I10 Essential (primary) hypertension: Secondary | ICD-10-CM | POA: Diagnosis not present

## 2020-11-03 DIAGNOSIS — G8929 Other chronic pain: Secondary | ICD-10-CM | POA: Diagnosis not present

## 2020-11-03 DIAGNOSIS — K219 Gastro-esophageal reflux disease without esophagitis: Secondary | ICD-10-CM | POA: Diagnosis not present

## 2020-11-03 DIAGNOSIS — S42202D Unspecified fracture of upper end of left humerus, subsequent encounter for fracture with routine healing: Secondary | ICD-10-CM | POA: Diagnosis not present

## 2020-11-03 DIAGNOSIS — S42201D Unspecified fracture of upper end of right humerus, subsequent encounter for fracture with routine healing: Secondary | ICD-10-CM | POA: Diagnosis not present

## 2020-11-03 DIAGNOSIS — F419 Anxiety disorder, unspecified: Secondary | ICD-10-CM | POA: Diagnosis not present

## 2020-11-07 DIAGNOSIS — I10 Essential (primary) hypertension: Secondary | ICD-10-CM | POA: Diagnosis not present

## 2020-11-07 DIAGNOSIS — G8929 Other chronic pain: Secondary | ICD-10-CM | POA: Diagnosis not present

## 2020-11-07 DIAGNOSIS — M545 Low back pain, unspecified: Secondary | ICD-10-CM | POA: Diagnosis not present

## 2020-11-07 DIAGNOSIS — S52502D Unspecified fracture of the lower end of left radius, subsequent encounter for closed fracture with routine healing: Secondary | ICD-10-CM | POA: Diagnosis not present

## 2020-11-07 DIAGNOSIS — S42202D Unspecified fracture of upper end of left humerus, subsequent encounter for fracture with routine healing: Secondary | ICD-10-CM | POA: Diagnosis not present

## 2020-11-07 DIAGNOSIS — K219 Gastro-esophageal reflux disease without esophagitis: Secondary | ICD-10-CM | POA: Diagnosis not present

## 2020-11-07 DIAGNOSIS — M48061 Spinal stenosis, lumbar region without neurogenic claudication: Secondary | ICD-10-CM | POA: Diagnosis not present

## 2020-11-07 DIAGNOSIS — F419 Anxiety disorder, unspecified: Secondary | ICD-10-CM | POA: Diagnosis not present

## 2020-11-07 DIAGNOSIS — S42201D Unspecified fracture of upper end of right humerus, subsequent encounter for fracture with routine healing: Secondary | ICD-10-CM | POA: Diagnosis not present

## 2020-11-09 DIAGNOSIS — S42201D Unspecified fracture of upper end of right humerus, subsequent encounter for fracture with routine healing: Secondary | ICD-10-CM | POA: Diagnosis not present

## 2020-11-09 DIAGNOSIS — G8929 Other chronic pain: Secondary | ICD-10-CM | POA: Diagnosis not present

## 2020-11-09 DIAGNOSIS — M545 Low back pain, unspecified: Secondary | ICD-10-CM | POA: Diagnosis not present

## 2020-11-09 DIAGNOSIS — I10 Essential (primary) hypertension: Secondary | ICD-10-CM | POA: Diagnosis not present

## 2020-11-09 DIAGNOSIS — F419 Anxiety disorder, unspecified: Secondary | ICD-10-CM | POA: Diagnosis not present

## 2020-11-09 DIAGNOSIS — M48061 Spinal stenosis, lumbar region without neurogenic claudication: Secondary | ICD-10-CM | POA: Diagnosis not present

## 2020-11-09 DIAGNOSIS — S42202D Unspecified fracture of upper end of left humerus, subsequent encounter for fracture with routine healing: Secondary | ICD-10-CM | POA: Diagnosis not present

## 2020-11-09 DIAGNOSIS — K219 Gastro-esophageal reflux disease without esophagitis: Secondary | ICD-10-CM | POA: Diagnosis not present

## 2020-11-09 DIAGNOSIS — S52502D Unspecified fracture of the lower end of left radius, subsequent encounter for closed fracture with routine healing: Secondary | ICD-10-CM | POA: Diagnosis not present

## 2020-11-10 DIAGNOSIS — I10 Essential (primary) hypertension: Secondary | ICD-10-CM | POA: Diagnosis not present

## 2020-11-10 DIAGNOSIS — F419 Anxiety disorder, unspecified: Secondary | ICD-10-CM | POA: Diagnosis not present

## 2020-11-10 DIAGNOSIS — S42202D Unspecified fracture of upper end of left humerus, subsequent encounter for fracture with routine healing: Secondary | ICD-10-CM | POA: Diagnosis not present

## 2020-11-10 DIAGNOSIS — K219 Gastro-esophageal reflux disease without esophagitis: Secondary | ICD-10-CM | POA: Diagnosis not present

## 2020-11-10 DIAGNOSIS — M545 Low back pain, unspecified: Secondary | ICD-10-CM | POA: Diagnosis not present

## 2020-11-10 DIAGNOSIS — M48061 Spinal stenosis, lumbar region without neurogenic claudication: Secondary | ICD-10-CM | POA: Diagnosis not present

## 2020-11-10 DIAGNOSIS — S42201D Unspecified fracture of upper end of right humerus, subsequent encounter for fracture with routine healing: Secondary | ICD-10-CM | POA: Diagnosis not present

## 2020-11-10 DIAGNOSIS — G8929 Other chronic pain: Secondary | ICD-10-CM | POA: Diagnosis not present

## 2020-11-10 DIAGNOSIS — S52502D Unspecified fracture of the lower end of left radius, subsequent encounter for closed fracture with routine healing: Secondary | ICD-10-CM | POA: Diagnosis not present

## 2020-11-14 DIAGNOSIS — S52502D Unspecified fracture of the lower end of left radius, subsequent encounter for closed fracture with routine healing: Secondary | ICD-10-CM | POA: Diagnosis not present

## 2020-11-14 DIAGNOSIS — K219 Gastro-esophageal reflux disease without esophagitis: Secondary | ICD-10-CM | POA: Diagnosis not present

## 2020-11-14 DIAGNOSIS — S42201D Unspecified fracture of upper end of right humerus, subsequent encounter for fracture with routine healing: Secondary | ICD-10-CM | POA: Diagnosis not present

## 2020-11-14 DIAGNOSIS — M545 Low back pain, unspecified: Secondary | ICD-10-CM | POA: Diagnosis not present

## 2020-11-14 DIAGNOSIS — F419 Anxiety disorder, unspecified: Secondary | ICD-10-CM | POA: Diagnosis not present

## 2020-11-14 DIAGNOSIS — M48061 Spinal stenosis, lumbar region without neurogenic claudication: Secondary | ICD-10-CM | POA: Diagnosis not present

## 2020-11-14 DIAGNOSIS — I10 Essential (primary) hypertension: Secondary | ICD-10-CM | POA: Diagnosis not present

## 2020-11-14 DIAGNOSIS — S42202D Unspecified fracture of upper end of left humerus, subsequent encounter for fracture with routine healing: Secondary | ICD-10-CM | POA: Diagnosis not present

## 2020-11-14 DIAGNOSIS — G8929 Other chronic pain: Secondary | ICD-10-CM | POA: Diagnosis not present

## 2020-11-17 DIAGNOSIS — M48061 Spinal stenosis, lumbar region without neurogenic claudication: Secondary | ICD-10-CM | POA: Diagnosis not present

## 2020-11-17 DIAGNOSIS — S52502D Unspecified fracture of the lower end of left radius, subsequent encounter for closed fracture with routine healing: Secondary | ICD-10-CM | POA: Diagnosis not present

## 2020-11-17 DIAGNOSIS — M545 Low back pain, unspecified: Secondary | ICD-10-CM | POA: Diagnosis not present

## 2020-11-17 DIAGNOSIS — F419 Anxiety disorder, unspecified: Secondary | ICD-10-CM | POA: Diagnosis not present

## 2020-11-17 DIAGNOSIS — S42202D Unspecified fracture of upper end of left humerus, subsequent encounter for fracture with routine healing: Secondary | ICD-10-CM | POA: Diagnosis not present

## 2020-11-17 DIAGNOSIS — S42201D Unspecified fracture of upper end of right humerus, subsequent encounter for fracture with routine healing: Secondary | ICD-10-CM | POA: Diagnosis not present

## 2020-11-17 DIAGNOSIS — I10 Essential (primary) hypertension: Secondary | ICD-10-CM | POA: Diagnosis not present

## 2020-11-17 DIAGNOSIS — G8929 Other chronic pain: Secondary | ICD-10-CM | POA: Diagnosis not present

## 2020-11-17 DIAGNOSIS — K219 Gastro-esophageal reflux disease without esophagitis: Secondary | ICD-10-CM | POA: Diagnosis not present

## 2020-11-21 DIAGNOSIS — K219 Gastro-esophageal reflux disease without esophagitis: Secondary | ICD-10-CM | POA: Diagnosis not present

## 2020-11-21 DIAGNOSIS — S42202D Unspecified fracture of upper end of left humerus, subsequent encounter for fracture with routine healing: Secondary | ICD-10-CM | POA: Diagnosis not present

## 2020-11-21 DIAGNOSIS — M48061 Spinal stenosis, lumbar region without neurogenic claudication: Secondary | ICD-10-CM | POA: Diagnosis not present

## 2020-11-21 DIAGNOSIS — S42201D Unspecified fracture of upper end of right humerus, subsequent encounter for fracture with routine healing: Secondary | ICD-10-CM | POA: Diagnosis not present

## 2020-11-21 DIAGNOSIS — S52502D Unspecified fracture of the lower end of left radius, subsequent encounter for closed fracture with routine healing: Secondary | ICD-10-CM | POA: Diagnosis not present

## 2020-11-21 DIAGNOSIS — F419 Anxiety disorder, unspecified: Secondary | ICD-10-CM | POA: Diagnosis not present

## 2020-11-21 DIAGNOSIS — M545 Low back pain, unspecified: Secondary | ICD-10-CM | POA: Diagnosis not present

## 2020-11-21 DIAGNOSIS — I10 Essential (primary) hypertension: Secondary | ICD-10-CM | POA: Diagnosis not present

## 2020-11-21 DIAGNOSIS — G8929 Other chronic pain: Secondary | ICD-10-CM | POA: Diagnosis not present

## 2020-11-24 DIAGNOSIS — G8929 Other chronic pain: Secondary | ICD-10-CM | POA: Diagnosis not present

## 2020-11-24 DIAGNOSIS — M48061 Spinal stenosis, lumbar region without neurogenic claudication: Secondary | ICD-10-CM | POA: Diagnosis not present

## 2020-11-24 DIAGNOSIS — K219 Gastro-esophageal reflux disease without esophagitis: Secondary | ICD-10-CM | POA: Diagnosis not present

## 2020-11-24 DIAGNOSIS — S42202D Unspecified fracture of upper end of left humerus, subsequent encounter for fracture with routine healing: Secondary | ICD-10-CM | POA: Diagnosis not present

## 2020-11-24 DIAGNOSIS — M545 Low back pain, unspecified: Secondary | ICD-10-CM | POA: Diagnosis not present

## 2020-11-24 DIAGNOSIS — I10 Essential (primary) hypertension: Secondary | ICD-10-CM | POA: Diagnosis not present

## 2020-11-24 DIAGNOSIS — S42201D Unspecified fracture of upper end of right humerus, subsequent encounter for fracture with routine healing: Secondary | ICD-10-CM | POA: Diagnosis not present

## 2020-11-24 DIAGNOSIS — S52502D Unspecified fracture of the lower end of left radius, subsequent encounter for closed fracture with routine healing: Secondary | ICD-10-CM | POA: Diagnosis not present

## 2020-11-24 DIAGNOSIS — F419 Anxiety disorder, unspecified: Secondary | ICD-10-CM | POA: Diagnosis not present

## 2020-11-27 DIAGNOSIS — S52502D Unspecified fracture of the lower end of left radius, subsequent encounter for closed fracture with routine healing: Secondary | ICD-10-CM | POA: Diagnosis not present

## 2020-11-27 DIAGNOSIS — S42202D Unspecified fracture of upper end of left humerus, subsequent encounter for fracture with routine healing: Secondary | ICD-10-CM | POA: Diagnosis not present

## 2020-11-27 DIAGNOSIS — S42201D Unspecified fracture of upper end of right humerus, subsequent encounter for fracture with routine healing: Secondary | ICD-10-CM | POA: Diagnosis not present

## 2020-11-27 DIAGNOSIS — I10 Essential (primary) hypertension: Secondary | ICD-10-CM | POA: Diagnosis not present

## 2020-11-27 DIAGNOSIS — M48061 Spinal stenosis, lumbar region without neurogenic claudication: Secondary | ICD-10-CM | POA: Diagnosis not present

## 2020-11-27 DIAGNOSIS — F419 Anxiety disorder, unspecified: Secondary | ICD-10-CM | POA: Diagnosis not present

## 2020-11-27 DIAGNOSIS — K219 Gastro-esophageal reflux disease without esophagitis: Secondary | ICD-10-CM | POA: Diagnosis not present

## 2020-11-27 DIAGNOSIS — G8929 Other chronic pain: Secondary | ICD-10-CM | POA: Diagnosis not present

## 2020-11-27 DIAGNOSIS — M545 Low back pain, unspecified: Secondary | ICD-10-CM | POA: Diagnosis not present

## 2020-11-28 DIAGNOSIS — G8929 Other chronic pain: Secondary | ICD-10-CM | POA: Diagnosis not present

## 2020-11-28 DIAGNOSIS — I10 Essential (primary) hypertension: Secondary | ICD-10-CM | POA: Diagnosis not present

## 2020-11-28 DIAGNOSIS — M48061 Spinal stenosis, lumbar region without neurogenic claudication: Secondary | ICD-10-CM | POA: Diagnosis not present

## 2020-11-28 DIAGNOSIS — S52502D Unspecified fracture of the lower end of left radius, subsequent encounter for closed fracture with routine healing: Secondary | ICD-10-CM | POA: Diagnosis not present

## 2020-11-28 DIAGNOSIS — M545 Low back pain, unspecified: Secondary | ICD-10-CM | POA: Diagnosis not present

## 2020-11-28 DIAGNOSIS — F419 Anxiety disorder, unspecified: Secondary | ICD-10-CM | POA: Diagnosis not present

## 2020-11-28 DIAGNOSIS — S42201D Unspecified fracture of upper end of right humerus, subsequent encounter for fracture with routine healing: Secondary | ICD-10-CM | POA: Diagnosis not present

## 2020-11-28 DIAGNOSIS — S42202D Unspecified fracture of upper end of left humerus, subsequent encounter for fracture with routine healing: Secondary | ICD-10-CM | POA: Diagnosis not present

## 2020-11-28 DIAGNOSIS — K219 Gastro-esophageal reflux disease without esophagitis: Secondary | ICD-10-CM | POA: Diagnosis not present

## 2020-12-01 DIAGNOSIS — M545 Low back pain, unspecified: Secondary | ICD-10-CM | POA: Diagnosis not present

## 2020-12-01 DIAGNOSIS — M48061 Spinal stenosis, lumbar region without neurogenic claudication: Secondary | ICD-10-CM | POA: Diagnosis not present

## 2020-12-01 DIAGNOSIS — F419 Anxiety disorder, unspecified: Secondary | ICD-10-CM | POA: Diagnosis not present

## 2020-12-01 DIAGNOSIS — S52502D Unspecified fracture of the lower end of left radius, subsequent encounter for closed fracture with routine healing: Secondary | ICD-10-CM | POA: Diagnosis not present

## 2020-12-01 DIAGNOSIS — S42201D Unspecified fracture of upper end of right humerus, subsequent encounter for fracture with routine healing: Secondary | ICD-10-CM | POA: Diagnosis not present

## 2020-12-01 DIAGNOSIS — K219 Gastro-esophageal reflux disease without esophagitis: Secondary | ICD-10-CM | POA: Diagnosis not present

## 2020-12-01 DIAGNOSIS — G8929 Other chronic pain: Secondary | ICD-10-CM | POA: Diagnosis not present

## 2020-12-01 DIAGNOSIS — I10 Essential (primary) hypertension: Secondary | ICD-10-CM | POA: Diagnosis not present

## 2020-12-01 DIAGNOSIS — S42202D Unspecified fracture of upper end of left humerus, subsequent encounter for fracture with routine healing: Secondary | ICD-10-CM | POA: Diagnosis not present

## 2020-12-05 DIAGNOSIS — K219 Gastro-esophageal reflux disease without esophagitis: Secondary | ICD-10-CM | POA: Diagnosis not present

## 2020-12-05 DIAGNOSIS — I10 Essential (primary) hypertension: Secondary | ICD-10-CM | POA: Diagnosis not present

## 2020-12-05 DIAGNOSIS — S52502D Unspecified fracture of the lower end of left radius, subsequent encounter for closed fracture with routine healing: Secondary | ICD-10-CM | POA: Diagnosis not present

## 2020-12-05 DIAGNOSIS — S42201D Unspecified fracture of upper end of right humerus, subsequent encounter for fracture with routine healing: Secondary | ICD-10-CM | POA: Diagnosis not present

## 2020-12-05 DIAGNOSIS — F419 Anxiety disorder, unspecified: Secondary | ICD-10-CM | POA: Diagnosis not present

## 2020-12-05 DIAGNOSIS — G8929 Other chronic pain: Secondary | ICD-10-CM | POA: Diagnosis not present

## 2020-12-05 DIAGNOSIS — M545 Low back pain, unspecified: Secondary | ICD-10-CM | POA: Diagnosis not present

## 2020-12-05 DIAGNOSIS — M48061 Spinal stenosis, lumbar region without neurogenic claudication: Secondary | ICD-10-CM | POA: Diagnosis not present

## 2020-12-05 DIAGNOSIS — S42202D Unspecified fracture of upper end of left humerus, subsequent encounter for fracture with routine healing: Secondary | ICD-10-CM | POA: Diagnosis not present

## 2020-12-07 DIAGNOSIS — S42201D Unspecified fracture of upper end of right humerus, subsequent encounter for fracture with routine healing: Secondary | ICD-10-CM | POA: Diagnosis not present

## 2020-12-07 DIAGNOSIS — S42202D Unspecified fracture of upper end of left humerus, subsequent encounter for fracture with routine healing: Secondary | ICD-10-CM | POA: Diagnosis not present

## 2020-12-07 DIAGNOSIS — I10 Essential (primary) hypertension: Secondary | ICD-10-CM | POA: Diagnosis not present

## 2020-12-07 DIAGNOSIS — M545 Low back pain, unspecified: Secondary | ICD-10-CM | POA: Diagnosis not present

## 2020-12-07 DIAGNOSIS — F419 Anxiety disorder, unspecified: Secondary | ICD-10-CM | POA: Diagnosis not present

## 2020-12-07 DIAGNOSIS — G8929 Other chronic pain: Secondary | ICD-10-CM | POA: Diagnosis not present

## 2020-12-07 DIAGNOSIS — K219 Gastro-esophageal reflux disease without esophagitis: Secondary | ICD-10-CM | POA: Diagnosis not present

## 2020-12-07 DIAGNOSIS — M48061 Spinal stenosis, lumbar region without neurogenic claudication: Secondary | ICD-10-CM | POA: Diagnosis not present

## 2020-12-07 DIAGNOSIS — S52502D Unspecified fracture of the lower end of left radius, subsequent encounter for closed fracture with routine healing: Secondary | ICD-10-CM | POA: Diagnosis not present

## 2020-12-12 DIAGNOSIS — S52502D Unspecified fracture of the lower end of left radius, subsequent encounter for closed fracture with routine healing: Secondary | ICD-10-CM | POA: Diagnosis not present

## 2020-12-12 DIAGNOSIS — M545 Low back pain, unspecified: Secondary | ICD-10-CM | POA: Diagnosis not present

## 2020-12-12 DIAGNOSIS — F419 Anxiety disorder, unspecified: Secondary | ICD-10-CM | POA: Diagnosis not present

## 2020-12-12 DIAGNOSIS — S42202D Unspecified fracture of upper end of left humerus, subsequent encounter for fracture with routine healing: Secondary | ICD-10-CM | POA: Diagnosis not present

## 2020-12-12 DIAGNOSIS — S42201D Unspecified fracture of upper end of right humerus, subsequent encounter for fracture with routine healing: Secondary | ICD-10-CM | POA: Diagnosis not present

## 2020-12-12 DIAGNOSIS — Z4789 Encounter for other orthopedic aftercare: Secondary | ICD-10-CM | POA: Diagnosis not present

## 2020-12-12 DIAGNOSIS — I10 Essential (primary) hypertension: Secondary | ICD-10-CM | POA: Diagnosis not present

## 2020-12-12 DIAGNOSIS — G8929 Other chronic pain: Secondary | ICD-10-CM | POA: Diagnosis not present

## 2020-12-12 DIAGNOSIS — K219 Gastro-esophageal reflux disease without esophagitis: Secondary | ICD-10-CM | POA: Diagnosis not present

## 2020-12-12 DIAGNOSIS — M48061 Spinal stenosis, lumbar region without neurogenic claudication: Secondary | ICD-10-CM | POA: Diagnosis not present

## 2020-12-15 DIAGNOSIS — K219 Gastro-esophageal reflux disease without esophagitis: Secondary | ICD-10-CM | POA: Diagnosis not present

## 2020-12-15 DIAGNOSIS — S42201D Unspecified fracture of upper end of right humerus, subsequent encounter for fracture with routine healing: Secondary | ICD-10-CM | POA: Diagnosis not present

## 2020-12-15 DIAGNOSIS — M545 Low back pain, unspecified: Secondary | ICD-10-CM | POA: Diagnosis not present

## 2020-12-15 DIAGNOSIS — I10 Essential (primary) hypertension: Secondary | ICD-10-CM | POA: Diagnosis not present

## 2020-12-15 DIAGNOSIS — M48061 Spinal stenosis, lumbar region without neurogenic claudication: Secondary | ICD-10-CM | POA: Diagnosis not present

## 2020-12-15 DIAGNOSIS — S42202D Unspecified fracture of upper end of left humerus, subsequent encounter for fracture with routine healing: Secondary | ICD-10-CM | POA: Diagnosis not present

## 2020-12-15 DIAGNOSIS — F419 Anxiety disorder, unspecified: Secondary | ICD-10-CM | POA: Diagnosis not present

## 2020-12-15 DIAGNOSIS — G8929 Other chronic pain: Secondary | ICD-10-CM | POA: Diagnosis not present

## 2020-12-15 DIAGNOSIS — S52502D Unspecified fracture of the lower end of left radius, subsequent encounter for closed fracture with routine healing: Secondary | ICD-10-CM | POA: Diagnosis not present

## 2020-12-20 DIAGNOSIS — M25511 Pain in right shoulder: Secondary | ICD-10-CM | POA: Diagnosis not present

## 2020-12-20 DIAGNOSIS — S42202S Unspecified fracture of upper end of left humerus, sequela: Secondary | ICD-10-CM | POA: Diagnosis not present

## 2020-12-20 DIAGNOSIS — M25512 Pain in left shoulder: Secondary | ICD-10-CM | POA: Diagnosis not present

## 2020-12-20 DIAGNOSIS — M25532 Pain in left wrist: Secondary | ICD-10-CM | POA: Diagnosis not present

## 2020-12-20 DIAGNOSIS — S52515S Nondisplaced fracture of left radial styloid process, sequela: Secondary | ICD-10-CM | POA: Diagnosis not present

## 2020-12-20 DIAGNOSIS — Z4789 Encounter for other orthopedic aftercare: Secondary | ICD-10-CM | POA: Diagnosis not present

## 2020-12-20 DIAGNOSIS — S42301S Unspecified fracture of shaft of humerus, right arm, sequela: Secondary | ICD-10-CM | POA: Diagnosis not present

## 2020-12-25 DIAGNOSIS — S52515S Nondisplaced fracture of left radial styloid process, sequela: Secondary | ICD-10-CM | POA: Diagnosis not present

## 2020-12-25 DIAGNOSIS — M25511 Pain in right shoulder: Secondary | ICD-10-CM | POA: Diagnosis not present

## 2020-12-25 DIAGNOSIS — M25532 Pain in left wrist: Secondary | ICD-10-CM | POA: Diagnosis not present

## 2020-12-25 DIAGNOSIS — Z4789 Encounter for other orthopedic aftercare: Secondary | ICD-10-CM | POA: Diagnosis not present

## 2020-12-25 DIAGNOSIS — M25512 Pain in left shoulder: Secondary | ICD-10-CM | POA: Diagnosis not present

## 2020-12-25 DIAGNOSIS — S42301S Unspecified fracture of shaft of humerus, right arm, sequela: Secondary | ICD-10-CM | POA: Diagnosis not present

## 2020-12-25 DIAGNOSIS — S42202S Unspecified fracture of upper end of left humerus, sequela: Secondary | ICD-10-CM | POA: Diagnosis not present

## 2020-12-28 DIAGNOSIS — S42202S Unspecified fracture of upper end of left humerus, sequela: Secondary | ICD-10-CM | POA: Diagnosis not present

## 2020-12-28 DIAGNOSIS — S52515S Nondisplaced fracture of left radial styloid process, sequela: Secondary | ICD-10-CM | POA: Diagnosis not present

## 2020-12-28 DIAGNOSIS — S42301S Unspecified fracture of shaft of humerus, right arm, sequela: Secondary | ICD-10-CM | POA: Diagnosis not present

## 2020-12-28 DIAGNOSIS — M25512 Pain in left shoulder: Secondary | ICD-10-CM | POA: Diagnosis not present

## 2020-12-28 DIAGNOSIS — M25532 Pain in left wrist: Secondary | ICD-10-CM | POA: Diagnosis not present

## 2020-12-28 DIAGNOSIS — Z4789 Encounter for other orthopedic aftercare: Secondary | ICD-10-CM | POA: Diagnosis not present

## 2020-12-28 DIAGNOSIS — M25511 Pain in right shoulder: Secondary | ICD-10-CM | POA: Diagnosis not present

## 2021-01-01 DIAGNOSIS — S52515S Nondisplaced fracture of left radial styloid process, sequela: Secondary | ICD-10-CM | POA: Diagnosis not present

## 2021-01-01 DIAGNOSIS — S42301S Unspecified fracture of shaft of humerus, right arm, sequela: Secondary | ICD-10-CM | POA: Diagnosis not present

## 2021-01-01 DIAGNOSIS — M25512 Pain in left shoulder: Secondary | ICD-10-CM | POA: Diagnosis not present

## 2021-01-01 DIAGNOSIS — S42202S Unspecified fracture of upper end of left humerus, sequela: Secondary | ICD-10-CM | POA: Diagnosis not present

## 2021-01-01 DIAGNOSIS — M25511 Pain in right shoulder: Secondary | ICD-10-CM | POA: Diagnosis not present

## 2021-01-01 DIAGNOSIS — Z4789 Encounter for other orthopedic aftercare: Secondary | ICD-10-CM | POA: Diagnosis not present

## 2021-01-01 DIAGNOSIS — M25532 Pain in left wrist: Secondary | ICD-10-CM | POA: Diagnosis not present

## 2021-01-04 DIAGNOSIS — M25512 Pain in left shoulder: Secondary | ICD-10-CM | POA: Diagnosis not present

## 2021-01-04 DIAGNOSIS — S42202S Unspecified fracture of upper end of left humerus, sequela: Secondary | ICD-10-CM | POA: Diagnosis not present

## 2021-01-04 DIAGNOSIS — M25532 Pain in left wrist: Secondary | ICD-10-CM | POA: Diagnosis not present

## 2021-01-04 DIAGNOSIS — S52515S Nondisplaced fracture of left radial styloid process, sequela: Secondary | ICD-10-CM | POA: Diagnosis not present

## 2021-01-04 DIAGNOSIS — M25511 Pain in right shoulder: Secondary | ICD-10-CM | POA: Diagnosis not present

## 2021-01-04 DIAGNOSIS — Z4789 Encounter for other orthopedic aftercare: Secondary | ICD-10-CM | POA: Diagnosis not present

## 2021-01-04 DIAGNOSIS — S42301S Unspecified fracture of shaft of humerus, right arm, sequela: Secondary | ICD-10-CM | POA: Diagnosis not present

## 2021-01-08 DIAGNOSIS — M25511 Pain in right shoulder: Secondary | ICD-10-CM | POA: Diagnosis not present

## 2021-01-08 DIAGNOSIS — M25532 Pain in left wrist: Secondary | ICD-10-CM | POA: Diagnosis not present

## 2021-01-08 DIAGNOSIS — S52515S Nondisplaced fracture of left radial styloid process, sequela: Secondary | ICD-10-CM | POA: Diagnosis not present

## 2021-01-08 DIAGNOSIS — M25512 Pain in left shoulder: Secondary | ICD-10-CM | POA: Diagnosis not present

## 2021-01-08 DIAGNOSIS — S42301S Unspecified fracture of shaft of humerus, right arm, sequela: Secondary | ICD-10-CM | POA: Diagnosis not present

## 2021-01-08 DIAGNOSIS — S42202S Unspecified fracture of upper end of left humerus, sequela: Secondary | ICD-10-CM | POA: Diagnosis not present

## 2021-01-08 DIAGNOSIS — Z4789 Encounter for other orthopedic aftercare: Secondary | ICD-10-CM | POA: Diagnosis not present

## 2021-01-11 DIAGNOSIS — S52515S Nondisplaced fracture of left radial styloid process, sequela: Secondary | ICD-10-CM | POA: Diagnosis not present

## 2021-01-11 DIAGNOSIS — M25511 Pain in right shoulder: Secondary | ICD-10-CM | POA: Diagnosis not present

## 2021-01-11 DIAGNOSIS — M25532 Pain in left wrist: Secondary | ICD-10-CM | POA: Diagnosis not present

## 2021-01-11 DIAGNOSIS — S42301S Unspecified fracture of shaft of humerus, right arm, sequela: Secondary | ICD-10-CM | POA: Diagnosis not present

## 2021-01-11 DIAGNOSIS — Z4789 Encounter for other orthopedic aftercare: Secondary | ICD-10-CM | POA: Diagnosis not present

## 2021-01-11 DIAGNOSIS — M25512 Pain in left shoulder: Secondary | ICD-10-CM | POA: Diagnosis not present

## 2021-01-11 DIAGNOSIS — S42202S Unspecified fracture of upper end of left humerus, sequela: Secondary | ICD-10-CM | POA: Diagnosis not present

## 2021-01-17 DIAGNOSIS — Z4789 Encounter for other orthopedic aftercare: Secondary | ICD-10-CM | POA: Diagnosis not present

## 2021-01-17 DIAGNOSIS — M25532 Pain in left wrist: Secondary | ICD-10-CM | POA: Diagnosis not present

## 2021-01-17 DIAGNOSIS — M25512 Pain in left shoulder: Secondary | ICD-10-CM | POA: Diagnosis not present

## 2021-01-17 DIAGNOSIS — S52515S Nondisplaced fracture of left radial styloid process, sequela: Secondary | ICD-10-CM | POA: Diagnosis not present

## 2021-01-17 DIAGNOSIS — S42202S Unspecified fracture of upper end of left humerus, sequela: Secondary | ICD-10-CM | POA: Diagnosis not present

## 2021-01-17 DIAGNOSIS — M25511 Pain in right shoulder: Secondary | ICD-10-CM | POA: Diagnosis not present

## 2021-01-17 DIAGNOSIS — S42301S Unspecified fracture of shaft of humerus, right arm, sequela: Secondary | ICD-10-CM | POA: Diagnosis not present

## 2021-01-18 DIAGNOSIS — M25532 Pain in left wrist: Secondary | ICD-10-CM | POA: Diagnosis not present

## 2021-01-18 DIAGNOSIS — S52515S Nondisplaced fracture of left radial styloid process, sequela: Secondary | ICD-10-CM | POA: Diagnosis not present

## 2021-01-18 DIAGNOSIS — M25511 Pain in right shoulder: Secondary | ICD-10-CM | POA: Diagnosis not present

## 2021-01-18 DIAGNOSIS — M25512 Pain in left shoulder: Secondary | ICD-10-CM | POA: Diagnosis not present

## 2021-01-18 DIAGNOSIS — S42301S Unspecified fracture of shaft of humerus, right arm, sequela: Secondary | ICD-10-CM | POA: Diagnosis not present

## 2021-01-18 DIAGNOSIS — Z4789 Encounter for other orthopedic aftercare: Secondary | ICD-10-CM | POA: Diagnosis not present

## 2021-01-18 DIAGNOSIS — S42202S Unspecified fracture of upper end of left humerus, sequela: Secondary | ICD-10-CM | POA: Diagnosis not present

## 2021-01-22 DIAGNOSIS — M25512 Pain in left shoulder: Secondary | ICD-10-CM | POA: Diagnosis not present

## 2021-01-22 DIAGNOSIS — S42301S Unspecified fracture of shaft of humerus, right arm, sequela: Secondary | ICD-10-CM | POA: Diagnosis not present

## 2021-01-22 DIAGNOSIS — S52515S Nondisplaced fracture of left radial styloid process, sequela: Secondary | ICD-10-CM | POA: Diagnosis not present

## 2021-01-22 DIAGNOSIS — M25511 Pain in right shoulder: Secondary | ICD-10-CM | POA: Diagnosis not present

## 2021-01-22 DIAGNOSIS — Z4789 Encounter for other orthopedic aftercare: Secondary | ICD-10-CM | POA: Diagnosis not present

## 2021-01-22 DIAGNOSIS — S42202S Unspecified fracture of upper end of left humerus, sequela: Secondary | ICD-10-CM | POA: Diagnosis not present

## 2021-01-22 DIAGNOSIS — M25532 Pain in left wrist: Secondary | ICD-10-CM | POA: Diagnosis not present

## 2021-01-25 DIAGNOSIS — S42202S Unspecified fracture of upper end of left humerus, sequela: Secondary | ICD-10-CM | POA: Diagnosis not present

## 2021-01-25 DIAGNOSIS — M25511 Pain in right shoulder: Secondary | ICD-10-CM | POA: Diagnosis not present

## 2021-01-25 DIAGNOSIS — S52515S Nondisplaced fracture of left radial styloid process, sequela: Secondary | ICD-10-CM | POA: Diagnosis not present

## 2021-01-25 DIAGNOSIS — M25532 Pain in left wrist: Secondary | ICD-10-CM | POA: Diagnosis not present

## 2021-01-25 DIAGNOSIS — M25512 Pain in left shoulder: Secondary | ICD-10-CM | POA: Diagnosis not present

## 2021-01-25 DIAGNOSIS — S42301S Unspecified fracture of shaft of humerus, right arm, sequela: Secondary | ICD-10-CM | POA: Diagnosis not present

## 2021-01-25 DIAGNOSIS — Z4789 Encounter for other orthopedic aftercare: Secondary | ICD-10-CM | POA: Diagnosis not present

## 2021-01-29 DIAGNOSIS — S52515S Nondisplaced fracture of left radial styloid process, sequela: Secondary | ICD-10-CM | POA: Diagnosis not present

## 2021-01-29 DIAGNOSIS — Z4789 Encounter for other orthopedic aftercare: Secondary | ICD-10-CM | POA: Diagnosis not present

## 2021-01-29 DIAGNOSIS — M25532 Pain in left wrist: Secondary | ICD-10-CM | POA: Diagnosis not present

## 2021-01-29 DIAGNOSIS — M25511 Pain in right shoulder: Secondary | ICD-10-CM | POA: Diagnosis not present

## 2021-01-29 DIAGNOSIS — S42202S Unspecified fracture of upper end of left humerus, sequela: Secondary | ICD-10-CM | POA: Diagnosis not present

## 2021-01-29 DIAGNOSIS — M25512 Pain in left shoulder: Secondary | ICD-10-CM | POA: Diagnosis not present

## 2021-01-29 DIAGNOSIS — S42301S Unspecified fracture of shaft of humerus, right arm, sequela: Secondary | ICD-10-CM | POA: Diagnosis not present

## 2021-01-30 DIAGNOSIS — K760 Fatty (change of) liver, not elsewhere classified: Secondary | ICD-10-CM | POA: Diagnosis not present

## 2021-01-30 DIAGNOSIS — I10 Essential (primary) hypertension: Secondary | ICD-10-CM | POA: Diagnosis not present

## 2021-01-30 DIAGNOSIS — Z9181 History of falling: Secondary | ICD-10-CM | POA: Diagnosis not present

## 2021-01-30 DIAGNOSIS — K219 Gastro-esophageal reflux disease without esophagitis: Secondary | ICD-10-CM | POA: Diagnosis not present

## 2021-01-30 DIAGNOSIS — R7301 Impaired fasting glucose: Secondary | ICD-10-CM | POA: Diagnosis not present

## 2021-01-30 DIAGNOSIS — E785 Hyperlipidemia, unspecified: Secondary | ICD-10-CM | POA: Diagnosis not present

## 2021-01-30 DIAGNOSIS — F419 Anxiety disorder, unspecified: Secondary | ICD-10-CM | POA: Diagnosis not present

## 2021-02-01 DIAGNOSIS — S42202S Unspecified fracture of upper end of left humerus, sequela: Secondary | ICD-10-CM | POA: Diagnosis not present

## 2021-02-01 DIAGNOSIS — M25512 Pain in left shoulder: Secondary | ICD-10-CM | POA: Diagnosis not present

## 2021-02-01 DIAGNOSIS — S52515S Nondisplaced fracture of left radial styloid process, sequela: Secondary | ICD-10-CM | POA: Diagnosis not present

## 2021-02-01 DIAGNOSIS — M25511 Pain in right shoulder: Secondary | ICD-10-CM | POA: Diagnosis not present

## 2021-02-01 DIAGNOSIS — M25532 Pain in left wrist: Secondary | ICD-10-CM | POA: Diagnosis not present

## 2021-02-01 DIAGNOSIS — S42301S Unspecified fracture of shaft of humerus, right arm, sequela: Secondary | ICD-10-CM | POA: Diagnosis not present

## 2021-02-01 DIAGNOSIS — Z4789 Encounter for other orthopedic aftercare: Secondary | ICD-10-CM | POA: Diagnosis not present

## 2021-02-05 DIAGNOSIS — S52515S Nondisplaced fracture of left radial styloid process, sequela: Secondary | ICD-10-CM | POA: Diagnosis not present

## 2021-02-05 DIAGNOSIS — S42202S Unspecified fracture of upper end of left humerus, sequela: Secondary | ICD-10-CM | POA: Diagnosis not present

## 2021-02-05 DIAGNOSIS — M25512 Pain in left shoulder: Secondary | ICD-10-CM | POA: Diagnosis not present

## 2021-02-05 DIAGNOSIS — M25532 Pain in left wrist: Secondary | ICD-10-CM | POA: Diagnosis not present

## 2021-02-05 DIAGNOSIS — M25511 Pain in right shoulder: Secondary | ICD-10-CM | POA: Diagnosis not present

## 2021-02-05 DIAGNOSIS — Z4789 Encounter for other orthopedic aftercare: Secondary | ICD-10-CM | POA: Diagnosis not present

## 2021-02-05 DIAGNOSIS — S42301S Unspecified fracture of shaft of humerus, right arm, sequela: Secondary | ICD-10-CM | POA: Diagnosis not present

## 2021-02-08 DIAGNOSIS — S42202S Unspecified fracture of upper end of left humerus, sequela: Secondary | ICD-10-CM | POA: Diagnosis not present

## 2021-02-08 DIAGNOSIS — S42301S Unspecified fracture of shaft of humerus, right arm, sequela: Secondary | ICD-10-CM | POA: Diagnosis not present

## 2021-02-08 DIAGNOSIS — M25512 Pain in left shoulder: Secondary | ICD-10-CM | POA: Diagnosis not present

## 2021-02-08 DIAGNOSIS — Z4789 Encounter for other orthopedic aftercare: Secondary | ICD-10-CM | POA: Diagnosis not present

## 2021-02-08 DIAGNOSIS — S52515S Nondisplaced fracture of left radial styloid process, sequela: Secondary | ICD-10-CM | POA: Diagnosis not present

## 2021-02-08 DIAGNOSIS — M25532 Pain in left wrist: Secondary | ICD-10-CM | POA: Diagnosis not present

## 2021-02-08 DIAGNOSIS — M25511 Pain in right shoulder: Secondary | ICD-10-CM | POA: Diagnosis not present

## 2021-02-12 DIAGNOSIS — Z4789 Encounter for other orthopedic aftercare: Secondary | ICD-10-CM | POA: Diagnosis not present

## 2021-02-12 DIAGNOSIS — S42202S Unspecified fracture of upper end of left humerus, sequela: Secondary | ICD-10-CM | POA: Diagnosis not present

## 2021-02-12 DIAGNOSIS — M25511 Pain in right shoulder: Secondary | ICD-10-CM | POA: Diagnosis not present

## 2021-02-12 DIAGNOSIS — S52515S Nondisplaced fracture of left radial styloid process, sequela: Secondary | ICD-10-CM | POA: Diagnosis not present

## 2021-02-12 DIAGNOSIS — M25532 Pain in left wrist: Secondary | ICD-10-CM | POA: Diagnosis not present

## 2021-02-12 DIAGNOSIS — M25512 Pain in left shoulder: Secondary | ICD-10-CM | POA: Diagnosis not present

## 2021-02-12 DIAGNOSIS — S42301S Unspecified fracture of shaft of humerus, right arm, sequela: Secondary | ICD-10-CM | POA: Diagnosis not present

## 2021-02-13 DIAGNOSIS — Z96612 Presence of left artificial shoulder joint: Secondary | ICD-10-CM | POA: Diagnosis not present

## 2021-02-13 DIAGNOSIS — M25511 Pain in right shoulder: Secondary | ICD-10-CM | POA: Diagnosis not present

## 2021-02-15 DIAGNOSIS — S52515S Nondisplaced fracture of left radial styloid process, sequela: Secondary | ICD-10-CM | POA: Diagnosis not present

## 2021-02-15 DIAGNOSIS — M25512 Pain in left shoulder: Secondary | ICD-10-CM | POA: Diagnosis not present

## 2021-02-15 DIAGNOSIS — M25511 Pain in right shoulder: Secondary | ICD-10-CM | POA: Diagnosis not present

## 2021-02-15 DIAGNOSIS — S42202S Unspecified fracture of upper end of left humerus, sequela: Secondary | ICD-10-CM | POA: Diagnosis not present

## 2021-02-15 DIAGNOSIS — M25532 Pain in left wrist: Secondary | ICD-10-CM | POA: Diagnosis not present

## 2021-02-15 DIAGNOSIS — Z4789 Encounter for other orthopedic aftercare: Secondary | ICD-10-CM | POA: Diagnosis not present

## 2021-02-15 DIAGNOSIS — S42301S Unspecified fracture of shaft of humerus, right arm, sequela: Secondary | ICD-10-CM | POA: Diagnosis not present

## 2021-02-19 DIAGNOSIS — M25532 Pain in left wrist: Secondary | ICD-10-CM | POA: Diagnosis not present

## 2021-02-19 DIAGNOSIS — M25511 Pain in right shoulder: Secondary | ICD-10-CM | POA: Diagnosis not present

## 2021-02-19 DIAGNOSIS — S42202S Unspecified fracture of upper end of left humerus, sequela: Secondary | ICD-10-CM | POA: Diagnosis not present

## 2021-02-19 DIAGNOSIS — M25512 Pain in left shoulder: Secondary | ICD-10-CM | POA: Diagnosis not present

## 2021-02-19 DIAGNOSIS — Z4789 Encounter for other orthopedic aftercare: Secondary | ICD-10-CM | POA: Diagnosis not present

## 2021-02-19 DIAGNOSIS — S42301S Unspecified fracture of shaft of humerus, right arm, sequela: Secondary | ICD-10-CM | POA: Diagnosis not present

## 2021-02-19 DIAGNOSIS — S52515S Nondisplaced fracture of left radial styloid process, sequela: Secondary | ICD-10-CM | POA: Diagnosis not present

## 2021-02-22 DIAGNOSIS — M25511 Pain in right shoulder: Secondary | ICD-10-CM | POA: Diagnosis not present

## 2021-02-22 DIAGNOSIS — S52515S Nondisplaced fracture of left radial styloid process, sequela: Secondary | ICD-10-CM | POA: Diagnosis not present

## 2021-02-22 DIAGNOSIS — Z4789 Encounter for other orthopedic aftercare: Secondary | ICD-10-CM | POA: Diagnosis not present

## 2021-02-22 DIAGNOSIS — M25532 Pain in left wrist: Secondary | ICD-10-CM | POA: Diagnosis not present

## 2021-02-22 DIAGNOSIS — S42301S Unspecified fracture of shaft of humerus, right arm, sequela: Secondary | ICD-10-CM | POA: Diagnosis not present

## 2021-02-22 DIAGNOSIS — M25512 Pain in left shoulder: Secondary | ICD-10-CM | POA: Diagnosis not present

## 2021-02-22 DIAGNOSIS — S42202S Unspecified fracture of upper end of left humerus, sequela: Secondary | ICD-10-CM | POA: Diagnosis not present

## 2021-02-26 DIAGNOSIS — S42202S Unspecified fracture of upper end of left humerus, sequela: Secondary | ICD-10-CM | POA: Diagnosis not present

## 2021-02-26 DIAGNOSIS — S52515S Nondisplaced fracture of left radial styloid process, sequela: Secondary | ICD-10-CM | POA: Diagnosis not present

## 2021-02-26 DIAGNOSIS — M25512 Pain in left shoulder: Secondary | ICD-10-CM | POA: Diagnosis not present

## 2021-02-26 DIAGNOSIS — M25532 Pain in left wrist: Secondary | ICD-10-CM | POA: Diagnosis not present

## 2021-02-26 DIAGNOSIS — S42301S Unspecified fracture of shaft of humerus, right arm, sequela: Secondary | ICD-10-CM | POA: Diagnosis not present

## 2021-02-26 DIAGNOSIS — Z4789 Encounter for other orthopedic aftercare: Secondary | ICD-10-CM | POA: Diagnosis not present

## 2021-02-26 DIAGNOSIS — M25511 Pain in right shoulder: Secondary | ICD-10-CM | POA: Diagnosis not present

## 2021-02-28 DIAGNOSIS — Z1331 Encounter for screening for depression: Secondary | ICD-10-CM | POA: Diagnosis not present

## 2021-02-28 DIAGNOSIS — Z6841 Body Mass Index (BMI) 40.0 and over, adult: Secondary | ICD-10-CM | POA: Diagnosis not present

## 2021-02-28 DIAGNOSIS — E669 Obesity, unspecified: Secondary | ICD-10-CM | POA: Diagnosis not present

## 2021-02-28 DIAGNOSIS — Z9181 History of falling: Secondary | ICD-10-CM | POA: Diagnosis not present

## 2021-02-28 DIAGNOSIS — Z0000 Encounter for general adult medical examination without abnormal findings: Secondary | ICD-10-CM | POA: Diagnosis not present

## 2021-02-28 DIAGNOSIS — E785 Hyperlipidemia, unspecified: Secondary | ICD-10-CM | POA: Diagnosis not present

## 2021-04-09 DIAGNOSIS — R21 Rash and other nonspecific skin eruption: Secondary | ICD-10-CM | POA: Diagnosis not present

## 2021-04-11 DIAGNOSIS — L304 Erythema intertrigo: Secondary | ICD-10-CM | POA: Diagnosis not present

## 2021-05-03 DIAGNOSIS — Z96612 Presence of left artificial shoulder joint: Secondary | ICD-10-CM | POA: Diagnosis not present

## 2021-05-17 DIAGNOSIS — M25561 Pain in right knee: Secondary | ICD-10-CM | POA: Diagnosis not present

## 2021-05-17 DIAGNOSIS — R600 Localized edema: Secondary | ICD-10-CM | POA: Diagnosis not present

## 2021-05-17 DIAGNOSIS — M79672 Pain in left foot: Secondary | ICD-10-CM | POA: Diagnosis not present

## 2021-05-17 DIAGNOSIS — G8929 Other chronic pain: Secondary | ICD-10-CM | POA: Diagnosis not present

## 2021-05-17 DIAGNOSIS — M25572 Pain in left ankle and joints of left foot: Secondary | ICD-10-CM | POA: Diagnosis not present

## 2021-05-17 DIAGNOSIS — M25571 Pain in right ankle and joints of right foot: Secondary | ICD-10-CM | POA: Diagnosis not present

## 2021-05-17 DIAGNOSIS — M79662 Pain in left lower leg: Secondary | ICD-10-CM | POA: Diagnosis not present

## 2021-05-17 DIAGNOSIS — M79671 Pain in right foot: Secondary | ICD-10-CM | POA: Diagnosis not present

## 2021-05-17 DIAGNOSIS — M25562 Pain in left knee: Secondary | ICD-10-CM | POA: Diagnosis not present

## 2021-05-17 DIAGNOSIS — M79661 Pain in right lower leg: Secondary | ICD-10-CM | POA: Diagnosis not present

## 2021-07-30 DIAGNOSIS — R7301 Impaired fasting glucose: Secondary | ICD-10-CM | POA: Diagnosis not present

## 2021-07-30 DIAGNOSIS — Z1231 Encounter for screening mammogram for malignant neoplasm of breast: Secondary | ICD-10-CM | POA: Diagnosis not present

## 2021-07-30 DIAGNOSIS — E785 Hyperlipidemia, unspecified: Secondary | ICD-10-CM | POA: Diagnosis not present

## 2021-07-30 DIAGNOSIS — K219 Gastro-esophageal reflux disease without esophagitis: Secondary | ICD-10-CM | POA: Diagnosis not present

## 2021-07-30 DIAGNOSIS — F419 Anxiety disorder, unspecified: Secondary | ICD-10-CM | POA: Diagnosis not present

## 2021-07-30 DIAGNOSIS — I10 Essential (primary) hypertension: Secondary | ICD-10-CM | POA: Diagnosis not present

## 2021-07-30 DIAGNOSIS — H8113 Benign paroxysmal vertigo, bilateral: Secondary | ICD-10-CM | POA: Diagnosis not present

## 2021-07-31 DIAGNOSIS — H25013 Cortical age-related cataract, bilateral: Secondary | ICD-10-CM | POA: Diagnosis not present

## 2021-07-31 DIAGNOSIS — H524 Presbyopia: Secondary | ICD-10-CM | POA: Diagnosis not present

## 2021-07-31 DIAGNOSIS — H40013 Open angle with borderline findings, low risk, bilateral: Secondary | ICD-10-CM | POA: Diagnosis not present

## 2021-07-31 DIAGNOSIS — H2513 Age-related nuclear cataract, bilateral: Secondary | ICD-10-CM | POA: Diagnosis not present

## 2021-07-31 DIAGNOSIS — H3562 Retinal hemorrhage, left eye: Secondary | ICD-10-CM | POA: Diagnosis not present

## 2021-07-31 DIAGNOSIS — H52223 Regular astigmatism, bilateral: Secondary | ICD-10-CM | POA: Diagnosis not present

## 2021-09-07 DIAGNOSIS — M545 Low back pain, unspecified: Secondary | ICD-10-CM | POA: Diagnosis not present

## 2021-09-20 DIAGNOSIS — M5136 Other intervertebral disc degeneration, lumbar region: Secondary | ICD-10-CM | POA: Diagnosis not present

## 2021-10-31 DIAGNOSIS — Z1231 Encounter for screening mammogram for malignant neoplasm of breast: Secondary | ICD-10-CM | POA: Diagnosis not present

## 2021-10-31 DIAGNOSIS — M810 Age-related osteoporosis without current pathological fracture: Secondary | ICD-10-CM | POA: Diagnosis not present

## 2021-10-31 DIAGNOSIS — M8589 Other specified disorders of bone density and structure, multiple sites: Secondary | ICD-10-CM | POA: Diagnosis not present

## 2021-10-31 DIAGNOSIS — M85852 Other specified disorders of bone density and structure, left thigh: Secondary | ICD-10-CM | POA: Diagnosis not present

## 2021-12-20 DIAGNOSIS — M9905 Segmental and somatic dysfunction of pelvic region: Secondary | ICD-10-CM | POA: Diagnosis not present

## 2021-12-20 DIAGNOSIS — M9904 Segmental and somatic dysfunction of sacral region: Secondary | ICD-10-CM | POA: Diagnosis not present

## 2021-12-20 DIAGNOSIS — M5416 Radiculopathy, lumbar region: Secondary | ICD-10-CM | POA: Diagnosis not present

## 2021-12-20 DIAGNOSIS — M5137 Other intervertebral disc degeneration, lumbosacral region: Secondary | ICD-10-CM | POA: Diagnosis not present

## 2021-12-20 DIAGNOSIS — M9903 Segmental and somatic dysfunction of lumbar region: Secondary | ICD-10-CM | POA: Diagnosis not present

## 2021-12-20 DIAGNOSIS — M5136 Other intervertebral disc degeneration, lumbar region: Secondary | ICD-10-CM | POA: Diagnosis not present

## 2021-12-20 DIAGNOSIS — M4317 Spondylolisthesis, lumbosacral region: Secondary | ICD-10-CM | POA: Diagnosis not present

## 2021-12-24 DIAGNOSIS — M5416 Radiculopathy, lumbar region: Secondary | ICD-10-CM | POA: Diagnosis not present

## 2021-12-24 DIAGNOSIS — M4317 Spondylolisthesis, lumbosacral region: Secondary | ICD-10-CM | POA: Diagnosis not present

## 2021-12-24 DIAGNOSIS — M9905 Segmental and somatic dysfunction of pelvic region: Secondary | ICD-10-CM | POA: Diagnosis not present

## 2021-12-24 DIAGNOSIS — M5137 Other intervertebral disc degeneration, lumbosacral region: Secondary | ICD-10-CM | POA: Diagnosis not present

## 2021-12-24 DIAGNOSIS — M5136 Other intervertebral disc degeneration, lumbar region: Secondary | ICD-10-CM | POA: Diagnosis not present

## 2021-12-24 DIAGNOSIS — M9904 Segmental and somatic dysfunction of sacral region: Secondary | ICD-10-CM | POA: Diagnosis not present

## 2021-12-24 DIAGNOSIS — M9903 Segmental and somatic dysfunction of lumbar region: Secondary | ICD-10-CM | POA: Diagnosis not present

## 2021-12-25 DIAGNOSIS — M5137 Other intervertebral disc degeneration, lumbosacral region: Secondary | ICD-10-CM | POA: Diagnosis not present

## 2021-12-25 DIAGNOSIS — M9903 Segmental and somatic dysfunction of lumbar region: Secondary | ICD-10-CM | POA: Diagnosis not present

## 2021-12-25 DIAGNOSIS — M5416 Radiculopathy, lumbar region: Secondary | ICD-10-CM | POA: Diagnosis not present

## 2021-12-25 DIAGNOSIS — M9904 Segmental and somatic dysfunction of sacral region: Secondary | ICD-10-CM | POA: Diagnosis not present

## 2021-12-25 DIAGNOSIS — M4317 Spondylolisthesis, lumbosacral region: Secondary | ICD-10-CM | POA: Diagnosis not present

## 2021-12-25 DIAGNOSIS — M5136 Other intervertebral disc degeneration, lumbar region: Secondary | ICD-10-CM | POA: Diagnosis not present

## 2021-12-25 DIAGNOSIS — M9905 Segmental and somatic dysfunction of pelvic region: Secondary | ICD-10-CM | POA: Diagnosis not present

## 2021-12-27 DIAGNOSIS — M9903 Segmental and somatic dysfunction of lumbar region: Secondary | ICD-10-CM | POA: Diagnosis not present

## 2021-12-27 DIAGNOSIS — M9905 Segmental and somatic dysfunction of pelvic region: Secondary | ICD-10-CM | POA: Diagnosis not present

## 2021-12-27 DIAGNOSIS — M5136 Other intervertebral disc degeneration, lumbar region: Secondary | ICD-10-CM | POA: Diagnosis not present

## 2021-12-27 DIAGNOSIS — M5416 Radiculopathy, lumbar region: Secondary | ICD-10-CM | POA: Diagnosis not present

## 2021-12-27 DIAGNOSIS — M5137 Other intervertebral disc degeneration, lumbosacral region: Secondary | ICD-10-CM | POA: Diagnosis not present

## 2021-12-27 DIAGNOSIS — M9904 Segmental and somatic dysfunction of sacral region: Secondary | ICD-10-CM | POA: Diagnosis not present

## 2021-12-27 DIAGNOSIS — M4317 Spondylolisthesis, lumbosacral region: Secondary | ICD-10-CM | POA: Diagnosis not present

## 2021-12-31 DIAGNOSIS — M9904 Segmental and somatic dysfunction of sacral region: Secondary | ICD-10-CM | POA: Diagnosis not present

## 2021-12-31 DIAGNOSIS — M4317 Spondylolisthesis, lumbosacral region: Secondary | ICD-10-CM | POA: Diagnosis not present

## 2021-12-31 DIAGNOSIS — M9903 Segmental and somatic dysfunction of lumbar region: Secondary | ICD-10-CM | POA: Diagnosis not present

## 2021-12-31 DIAGNOSIS — M5137 Other intervertebral disc degeneration, lumbosacral region: Secondary | ICD-10-CM | POA: Diagnosis not present

## 2021-12-31 DIAGNOSIS — M9905 Segmental and somatic dysfunction of pelvic region: Secondary | ICD-10-CM | POA: Diagnosis not present

## 2021-12-31 DIAGNOSIS — M5136 Other intervertebral disc degeneration, lumbar region: Secondary | ICD-10-CM | POA: Diagnosis not present

## 2021-12-31 DIAGNOSIS — M5416 Radiculopathy, lumbar region: Secondary | ICD-10-CM | POA: Diagnosis not present

## 2022-01-01 DIAGNOSIS — M5137 Other intervertebral disc degeneration, lumbosacral region: Secondary | ICD-10-CM | POA: Diagnosis not present

## 2022-01-01 DIAGNOSIS — M9903 Segmental and somatic dysfunction of lumbar region: Secondary | ICD-10-CM | POA: Diagnosis not present

## 2022-01-01 DIAGNOSIS — M5416 Radiculopathy, lumbar region: Secondary | ICD-10-CM | POA: Diagnosis not present

## 2022-01-01 DIAGNOSIS — M9905 Segmental and somatic dysfunction of pelvic region: Secondary | ICD-10-CM | POA: Diagnosis not present

## 2022-01-01 DIAGNOSIS — M9904 Segmental and somatic dysfunction of sacral region: Secondary | ICD-10-CM | POA: Diagnosis not present

## 2022-01-01 DIAGNOSIS — M5136 Other intervertebral disc degeneration, lumbar region: Secondary | ICD-10-CM | POA: Diagnosis not present

## 2022-01-01 DIAGNOSIS — M4317 Spondylolisthesis, lumbosacral region: Secondary | ICD-10-CM | POA: Diagnosis not present

## 2022-01-03 DIAGNOSIS — M5137 Other intervertebral disc degeneration, lumbosacral region: Secondary | ICD-10-CM | POA: Diagnosis not present

## 2022-01-03 DIAGNOSIS — M9904 Segmental and somatic dysfunction of sacral region: Secondary | ICD-10-CM | POA: Diagnosis not present

## 2022-01-03 DIAGNOSIS — M5136 Other intervertebral disc degeneration, lumbar region: Secondary | ICD-10-CM | POA: Diagnosis not present

## 2022-01-03 DIAGNOSIS — M9903 Segmental and somatic dysfunction of lumbar region: Secondary | ICD-10-CM | POA: Diagnosis not present

## 2022-01-03 DIAGNOSIS — M5416 Radiculopathy, lumbar region: Secondary | ICD-10-CM | POA: Diagnosis not present

## 2022-01-03 DIAGNOSIS — M9905 Segmental and somatic dysfunction of pelvic region: Secondary | ICD-10-CM | POA: Diagnosis not present

## 2022-01-03 DIAGNOSIS — M4317 Spondylolisthesis, lumbosacral region: Secondary | ICD-10-CM | POA: Diagnosis not present

## 2022-01-07 DIAGNOSIS — M5136 Other intervertebral disc degeneration, lumbar region: Secondary | ICD-10-CM | POA: Diagnosis not present

## 2022-01-07 DIAGNOSIS — M9904 Segmental and somatic dysfunction of sacral region: Secondary | ICD-10-CM | POA: Diagnosis not present

## 2022-01-07 DIAGNOSIS — M4317 Spondylolisthesis, lumbosacral region: Secondary | ICD-10-CM | POA: Diagnosis not present

## 2022-01-07 DIAGNOSIS — M5416 Radiculopathy, lumbar region: Secondary | ICD-10-CM | POA: Diagnosis not present

## 2022-01-07 DIAGNOSIS — M9905 Segmental and somatic dysfunction of pelvic region: Secondary | ICD-10-CM | POA: Diagnosis not present

## 2022-01-07 DIAGNOSIS — M5137 Other intervertebral disc degeneration, lumbosacral region: Secondary | ICD-10-CM | POA: Diagnosis not present

## 2022-01-07 DIAGNOSIS — M9903 Segmental and somatic dysfunction of lumbar region: Secondary | ICD-10-CM | POA: Diagnosis not present

## 2022-01-08 DIAGNOSIS — M9905 Segmental and somatic dysfunction of pelvic region: Secondary | ICD-10-CM | POA: Diagnosis not present

## 2022-01-08 DIAGNOSIS — M9904 Segmental and somatic dysfunction of sacral region: Secondary | ICD-10-CM | POA: Diagnosis not present

## 2022-01-08 DIAGNOSIS — M9903 Segmental and somatic dysfunction of lumbar region: Secondary | ICD-10-CM | POA: Diagnosis not present

## 2022-01-08 DIAGNOSIS — M5136 Other intervertebral disc degeneration, lumbar region: Secondary | ICD-10-CM | POA: Diagnosis not present

## 2022-01-08 DIAGNOSIS — M5137 Other intervertebral disc degeneration, lumbosacral region: Secondary | ICD-10-CM | POA: Diagnosis not present

## 2022-01-08 DIAGNOSIS — M4317 Spondylolisthesis, lumbosacral region: Secondary | ICD-10-CM | POA: Diagnosis not present

## 2022-01-08 DIAGNOSIS — M5416 Radiculopathy, lumbar region: Secondary | ICD-10-CM | POA: Diagnosis not present

## 2022-01-10 DIAGNOSIS — M9905 Segmental and somatic dysfunction of pelvic region: Secondary | ICD-10-CM | POA: Diagnosis not present

## 2022-01-10 DIAGNOSIS — E785 Hyperlipidemia, unspecified: Secondary | ICD-10-CM | POA: Diagnosis not present

## 2022-01-10 DIAGNOSIS — M9903 Segmental and somatic dysfunction of lumbar region: Secondary | ICD-10-CM | POA: Diagnosis not present

## 2022-01-10 DIAGNOSIS — M5137 Other intervertebral disc degeneration, lumbosacral region: Secondary | ICD-10-CM | POA: Diagnosis not present

## 2022-01-10 DIAGNOSIS — M9904 Segmental and somatic dysfunction of sacral region: Secondary | ICD-10-CM | POA: Diagnosis not present

## 2022-01-10 DIAGNOSIS — M4317 Spondylolisthesis, lumbosacral region: Secondary | ICD-10-CM | POA: Diagnosis not present

## 2022-01-10 DIAGNOSIS — M5416 Radiculopathy, lumbar region: Secondary | ICD-10-CM | POA: Diagnosis not present

## 2022-01-10 DIAGNOSIS — K219 Gastro-esophageal reflux disease without esophagitis: Secondary | ICD-10-CM | POA: Diagnosis not present

## 2022-01-10 DIAGNOSIS — M5136 Other intervertebral disc degeneration, lumbar region: Secondary | ICD-10-CM | POA: Diagnosis not present

## 2022-01-16 DIAGNOSIS — M5137 Other intervertebral disc degeneration, lumbosacral region: Secondary | ICD-10-CM | POA: Diagnosis not present

## 2022-01-16 DIAGNOSIS — M9905 Segmental and somatic dysfunction of pelvic region: Secondary | ICD-10-CM | POA: Diagnosis not present

## 2022-01-16 DIAGNOSIS — M9903 Segmental and somatic dysfunction of lumbar region: Secondary | ICD-10-CM | POA: Diagnosis not present

## 2022-01-16 DIAGNOSIS — M9904 Segmental and somatic dysfunction of sacral region: Secondary | ICD-10-CM | POA: Diagnosis not present

## 2022-01-16 DIAGNOSIS — M5416 Radiculopathy, lumbar region: Secondary | ICD-10-CM | POA: Diagnosis not present

## 2022-01-16 DIAGNOSIS — M5136 Other intervertebral disc degeneration, lumbar region: Secondary | ICD-10-CM | POA: Diagnosis not present

## 2022-01-16 DIAGNOSIS — M4317 Spondylolisthesis, lumbosacral region: Secondary | ICD-10-CM | POA: Diagnosis not present

## 2022-01-17 DIAGNOSIS — M4317 Spondylolisthesis, lumbosacral region: Secondary | ICD-10-CM | POA: Diagnosis not present

## 2022-01-17 DIAGNOSIS — M9904 Segmental and somatic dysfunction of sacral region: Secondary | ICD-10-CM | POA: Diagnosis not present

## 2022-01-17 DIAGNOSIS — M5416 Radiculopathy, lumbar region: Secondary | ICD-10-CM | POA: Diagnosis not present

## 2022-01-17 DIAGNOSIS — M5137 Other intervertebral disc degeneration, lumbosacral region: Secondary | ICD-10-CM | POA: Diagnosis not present

## 2022-01-17 DIAGNOSIS — M9905 Segmental and somatic dysfunction of pelvic region: Secondary | ICD-10-CM | POA: Diagnosis not present

## 2022-01-17 DIAGNOSIS — M5136 Other intervertebral disc degeneration, lumbar region: Secondary | ICD-10-CM | POA: Diagnosis not present

## 2022-01-17 DIAGNOSIS — M9903 Segmental and somatic dysfunction of lumbar region: Secondary | ICD-10-CM | POA: Diagnosis not present

## 2022-01-22 DIAGNOSIS — M5136 Other intervertebral disc degeneration, lumbar region: Secondary | ICD-10-CM | POA: Diagnosis not present

## 2022-01-22 DIAGNOSIS — M9903 Segmental and somatic dysfunction of lumbar region: Secondary | ICD-10-CM | POA: Diagnosis not present

## 2022-01-22 DIAGNOSIS — M5137 Other intervertebral disc degeneration, lumbosacral region: Secondary | ICD-10-CM | POA: Diagnosis not present

## 2022-01-22 DIAGNOSIS — M5416 Radiculopathy, lumbar region: Secondary | ICD-10-CM | POA: Diagnosis not present

## 2022-01-22 DIAGNOSIS — M4317 Spondylolisthesis, lumbosacral region: Secondary | ICD-10-CM | POA: Diagnosis not present

## 2022-01-22 DIAGNOSIS — M9905 Segmental and somatic dysfunction of pelvic region: Secondary | ICD-10-CM | POA: Diagnosis not present

## 2022-01-22 DIAGNOSIS — M9904 Segmental and somatic dysfunction of sacral region: Secondary | ICD-10-CM | POA: Diagnosis not present

## 2022-01-24 DIAGNOSIS — M5136 Other intervertebral disc degeneration, lumbar region: Secondary | ICD-10-CM | POA: Diagnosis not present

## 2022-01-24 DIAGNOSIS — M9905 Segmental and somatic dysfunction of pelvic region: Secondary | ICD-10-CM | POA: Diagnosis not present

## 2022-01-24 DIAGNOSIS — M5137 Other intervertebral disc degeneration, lumbosacral region: Secondary | ICD-10-CM | POA: Diagnosis not present

## 2022-01-24 DIAGNOSIS — M9903 Segmental and somatic dysfunction of lumbar region: Secondary | ICD-10-CM | POA: Diagnosis not present

## 2022-01-24 DIAGNOSIS — M4317 Spondylolisthesis, lumbosacral region: Secondary | ICD-10-CM | POA: Diagnosis not present

## 2022-01-24 DIAGNOSIS — M5416 Radiculopathy, lumbar region: Secondary | ICD-10-CM | POA: Diagnosis not present

## 2022-01-24 DIAGNOSIS — M9904 Segmental and somatic dysfunction of sacral region: Secondary | ICD-10-CM | POA: Diagnosis not present

## 2022-01-28 DIAGNOSIS — M5416 Radiculopathy, lumbar region: Secondary | ICD-10-CM | POA: Diagnosis not present

## 2022-01-28 DIAGNOSIS — M9905 Segmental and somatic dysfunction of pelvic region: Secondary | ICD-10-CM | POA: Diagnosis not present

## 2022-01-28 DIAGNOSIS — M9904 Segmental and somatic dysfunction of sacral region: Secondary | ICD-10-CM | POA: Diagnosis not present

## 2022-01-28 DIAGNOSIS — M9903 Segmental and somatic dysfunction of lumbar region: Secondary | ICD-10-CM | POA: Diagnosis not present

## 2022-01-28 DIAGNOSIS — M4317 Spondylolisthesis, lumbosacral region: Secondary | ICD-10-CM | POA: Diagnosis not present

## 2022-01-28 DIAGNOSIS — M5136 Other intervertebral disc degeneration, lumbar region: Secondary | ICD-10-CM | POA: Diagnosis not present

## 2022-01-28 DIAGNOSIS — M5137 Other intervertebral disc degeneration, lumbosacral region: Secondary | ICD-10-CM | POA: Diagnosis not present

## 2022-01-31 DIAGNOSIS — M9904 Segmental and somatic dysfunction of sacral region: Secondary | ICD-10-CM | POA: Diagnosis not present

## 2022-01-31 DIAGNOSIS — M5137 Other intervertebral disc degeneration, lumbosacral region: Secondary | ICD-10-CM | POA: Diagnosis not present

## 2022-01-31 DIAGNOSIS — M4317 Spondylolisthesis, lumbosacral region: Secondary | ICD-10-CM | POA: Diagnosis not present

## 2022-01-31 DIAGNOSIS — M9905 Segmental and somatic dysfunction of pelvic region: Secondary | ICD-10-CM | POA: Diagnosis not present

## 2022-01-31 DIAGNOSIS — M5416 Radiculopathy, lumbar region: Secondary | ICD-10-CM | POA: Diagnosis not present

## 2022-01-31 DIAGNOSIS — M9903 Segmental and somatic dysfunction of lumbar region: Secondary | ICD-10-CM | POA: Diagnosis not present

## 2022-01-31 DIAGNOSIS — M5136 Other intervertebral disc degeneration, lumbar region: Secondary | ICD-10-CM | POA: Diagnosis not present

## 2022-02-04 DIAGNOSIS — M5136 Other intervertebral disc degeneration, lumbar region: Secondary | ICD-10-CM | POA: Diagnosis not present

## 2022-02-04 DIAGNOSIS — M9905 Segmental and somatic dysfunction of pelvic region: Secondary | ICD-10-CM | POA: Diagnosis not present

## 2022-02-04 DIAGNOSIS — M9904 Segmental and somatic dysfunction of sacral region: Secondary | ICD-10-CM | POA: Diagnosis not present

## 2022-02-04 DIAGNOSIS — M5416 Radiculopathy, lumbar region: Secondary | ICD-10-CM | POA: Diagnosis not present

## 2022-02-04 DIAGNOSIS — M9903 Segmental and somatic dysfunction of lumbar region: Secondary | ICD-10-CM | POA: Diagnosis not present

## 2022-02-04 DIAGNOSIS — M4317 Spondylolisthesis, lumbosacral region: Secondary | ICD-10-CM | POA: Diagnosis not present

## 2022-02-04 DIAGNOSIS — M5137 Other intervertebral disc degeneration, lumbosacral region: Secondary | ICD-10-CM | POA: Diagnosis not present

## 2022-02-06 DIAGNOSIS — M9904 Segmental and somatic dysfunction of sacral region: Secondary | ICD-10-CM | POA: Diagnosis not present

## 2022-02-06 DIAGNOSIS — M9905 Segmental and somatic dysfunction of pelvic region: Secondary | ICD-10-CM | POA: Diagnosis not present

## 2022-02-06 DIAGNOSIS — M9903 Segmental and somatic dysfunction of lumbar region: Secondary | ICD-10-CM | POA: Diagnosis not present

## 2022-02-06 DIAGNOSIS — M5136 Other intervertebral disc degeneration, lumbar region: Secondary | ICD-10-CM | POA: Diagnosis not present

## 2022-02-06 DIAGNOSIS — M4317 Spondylolisthesis, lumbosacral region: Secondary | ICD-10-CM | POA: Diagnosis not present

## 2022-02-06 DIAGNOSIS — M5137 Other intervertebral disc degeneration, lumbosacral region: Secondary | ICD-10-CM | POA: Diagnosis not present

## 2022-02-06 DIAGNOSIS — M5416 Radiculopathy, lumbar region: Secondary | ICD-10-CM | POA: Diagnosis not present

## 2022-02-07 DIAGNOSIS — I10 Essential (primary) hypertension: Secondary | ICD-10-CM | POA: Diagnosis not present

## 2022-02-07 DIAGNOSIS — R7301 Impaired fasting glucose: Secondary | ICD-10-CM | POA: Diagnosis not present

## 2022-02-07 DIAGNOSIS — H8113 Benign paroxysmal vertigo, bilateral: Secondary | ICD-10-CM | POA: Diagnosis not present

## 2022-02-07 DIAGNOSIS — Z139 Encounter for screening, unspecified: Secondary | ICD-10-CM | POA: Diagnosis not present

## 2022-02-07 DIAGNOSIS — F419 Anxiety disorder, unspecified: Secondary | ICD-10-CM | POA: Diagnosis not present

## 2022-02-07 DIAGNOSIS — E785 Hyperlipidemia, unspecified: Secondary | ICD-10-CM | POA: Diagnosis not present

## 2022-02-07 DIAGNOSIS — R600 Localized edema: Secondary | ICD-10-CM | POA: Diagnosis not present

## 2022-02-07 DIAGNOSIS — K219 Gastro-esophageal reflux disease without esophagitis: Secondary | ICD-10-CM | POA: Diagnosis not present

## 2022-02-11 DIAGNOSIS — M5136 Other intervertebral disc degeneration, lumbar region: Secondary | ICD-10-CM | POA: Diagnosis not present

## 2022-02-11 DIAGNOSIS — M9905 Segmental and somatic dysfunction of pelvic region: Secondary | ICD-10-CM | POA: Diagnosis not present

## 2022-02-11 DIAGNOSIS — M5416 Radiculopathy, lumbar region: Secondary | ICD-10-CM | POA: Diagnosis not present

## 2022-02-11 DIAGNOSIS — M5137 Other intervertebral disc degeneration, lumbosacral region: Secondary | ICD-10-CM | POA: Diagnosis not present

## 2022-02-11 DIAGNOSIS — M4317 Spondylolisthesis, lumbosacral region: Secondary | ICD-10-CM | POA: Diagnosis not present

## 2022-02-11 DIAGNOSIS — M9903 Segmental and somatic dysfunction of lumbar region: Secondary | ICD-10-CM | POA: Diagnosis not present

## 2022-02-11 DIAGNOSIS — M9904 Segmental and somatic dysfunction of sacral region: Secondary | ICD-10-CM | POA: Diagnosis not present

## 2022-02-25 DIAGNOSIS — M5137 Other intervertebral disc degeneration, lumbosacral region: Secondary | ICD-10-CM | POA: Diagnosis not present

## 2022-02-25 DIAGNOSIS — M9904 Segmental and somatic dysfunction of sacral region: Secondary | ICD-10-CM | POA: Diagnosis not present

## 2022-02-25 DIAGNOSIS — M5136 Other intervertebral disc degeneration, lumbar region: Secondary | ICD-10-CM | POA: Diagnosis not present

## 2022-02-25 DIAGNOSIS — M9903 Segmental and somatic dysfunction of lumbar region: Secondary | ICD-10-CM | POA: Diagnosis not present

## 2022-02-25 DIAGNOSIS — M5416 Radiculopathy, lumbar region: Secondary | ICD-10-CM | POA: Diagnosis not present

## 2022-02-25 DIAGNOSIS — M4317 Spondylolisthesis, lumbosacral region: Secondary | ICD-10-CM | POA: Diagnosis not present

## 2022-02-25 DIAGNOSIS — M9905 Segmental and somatic dysfunction of pelvic region: Secondary | ICD-10-CM | POA: Diagnosis not present

## 2022-08-08 DIAGNOSIS — L814 Other melanin hyperpigmentation: Secondary | ICD-10-CM | POA: Diagnosis not present

## 2022-08-08 DIAGNOSIS — L821 Other seborrheic keratosis: Secondary | ICD-10-CM | POA: Diagnosis not present

## 2022-08-08 DIAGNOSIS — D225 Melanocytic nevi of trunk: Secondary | ICD-10-CM | POA: Diagnosis not present

## 2022-08-08 DIAGNOSIS — D2239 Melanocytic nevi of other parts of face: Secondary | ICD-10-CM | POA: Diagnosis not present

## 2022-08-13 DIAGNOSIS — K219 Gastro-esophageal reflux disease without esophagitis: Secondary | ICD-10-CM | POA: Diagnosis not present

## 2022-08-13 DIAGNOSIS — R7301 Impaired fasting glucose: Secondary | ICD-10-CM | POA: Diagnosis not present

## 2022-08-13 DIAGNOSIS — K760 Fatty (change of) liver, not elsewhere classified: Secondary | ICD-10-CM | POA: Diagnosis not present

## 2022-08-13 DIAGNOSIS — E785 Hyperlipidemia, unspecified: Secondary | ICD-10-CM | POA: Diagnosis not present

## 2022-08-13 DIAGNOSIS — I10 Essential (primary) hypertension: Secondary | ICD-10-CM | POA: Diagnosis not present

## 2022-09-16 DIAGNOSIS — R21 Rash and other nonspecific skin eruption: Secondary | ICD-10-CM | POA: Diagnosis not present

## 2022-09-19 DIAGNOSIS — L299 Pruritus, unspecified: Secondary | ICD-10-CM | POA: Diagnosis not present

## 2022-09-19 DIAGNOSIS — L308 Other specified dermatitis: Secondary | ICD-10-CM | POA: Diagnosis not present

## 2022-09-19 DIAGNOSIS — D485 Neoplasm of uncertain behavior of skin: Secondary | ICD-10-CM | POA: Diagnosis not present

## 2022-10-11 DIAGNOSIS — K219 Gastro-esophageal reflux disease without esophagitis: Secondary | ICD-10-CM | POA: Diagnosis not present

## 2022-10-11 DIAGNOSIS — M810 Age-related osteoporosis without current pathological fracture: Secondary | ICD-10-CM | POA: Diagnosis not present

## 2022-10-11 DIAGNOSIS — F419 Anxiety disorder, unspecified: Secondary | ICD-10-CM | POA: Diagnosis not present

## 2022-10-11 DIAGNOSIS — E785 Hyperlipidemia, unspecified: Secondary | ICD-10-CM | POA: Diagnosis not present

## 2022-10-18 DIAGNOSIS — L309 Dermatitis, unspecified: Secondary | ICD-10-CM | POA: Diagnosis not present

## 2022-11-20 DIAGNOSIS — H25813 Combined forms of age-related cataract, bilateral: Secondary | ICD-10-CM | POA: Diagnosis not present

## 2022-11-20 DIAGNOSIS — H40013 Open angle with borderline findings, low risk, bilateral: Secondary | ICD-10-CM | POA: Diagnosis not present

## 2022-11-20 DIAGNOSIS — H52223 Regular astigmatism, bilateral: Secondary | ICD-10-CM | POA: Diagnosis not present

## 2022-11-20 DIAGNOSIS — H524 Presbyopia: Secondary | ICD-10-CM | POA: Diagnosis not present

## 2022-12-27 DIAGNOSIS — D509 Iron deficiency anemia, unspecified: Secondary | ICD-10-CM | POA: Diagnosis not present

## 2022-12-27 DIAGNOSIS — M793 Panniculitis, unspecified: Secondary | ICD-10-CM | POA: Diagnosis not present

## 2022-12-27 DIAGNOSIS — R5383 Other fatigue: Secondary | ICD-10-CM | POA: Diagnosis not present

## 2022-12-27 DIAGNOSIS — R000 Tachycardia, unspecified: Secondary | ICD-10-CM | POA: Diagnosis not present

## 2022-12-27 DIAGNOSIS — S31109A Unspecified open wound of abdominal wall, unspecified quadrant without penetration into peritoneal cavity, initial encounter: Secondary | ICD-10-CM | POA: Diagnosis not present

## 2022-12-27 DIAGNOSIS — B379 Candidiasis, unspecified: Secondary | ICD-10-CM | POA: Diagnosis not present

## 2022-12-27 DIAGNOSIS — D531 Other megaloblastic anemias, not elsewhere classified: Secondary | ICD-10-CM | POA: Diagnosis not present

## 2022-12-30 DIAGNOSIS — D531 Other megaloblastic anemias, not elsewhere classified: Secondary | ICD-10-CM | POA: Diagnosis not present

## 2022-12-30 DIAGNOSIS — R000 Tachycardia, unspecified: Secondary | ICD-10-CM | POA: Diagnosis not present

## 2022-12-30 DIAGNOSIS — R5383 Other fatigue: Secondary | ICD-10-CM | POA: Diagnosis not present

## 2022-12-30 DIAGNOSIS — D509 Iron deficiency anemia, unspecified: Secondary | ICD-10-CM | POA: Diagnosis not present

## 2023-01-01 DIAGNOSIS — R7401 Elevation of levels of liver transaminase levels: Secondary | ICD-10-CM | POA: Diagnosis not present

## 2023-01-03 DIAGNOSIS — K7689 Other specified diseases of liver: Secondary | ICD-10-CM | POA: Diagnosis not present

## 2023-01-03 DIAGNOSIS — K760 Fatty (change of) liver, not elsewhere classified: Secondary | ICD-10-CM | POA: Diagnosis not present

## 2023-01-03 DIAGNOSIS — K8020 Calculus of gallbladder without cholecystitis without obstruction: Secondary | ICD-10-CM | POA: Diagnosis not present

## 2023-01-07 ENCOUNTER — Telehealth: Payer: Self-pay | Admitting: Oncology

## 2023-01-07 NOTE — Telephone Encounter (Signed)
01/07/23 Patient rescheduled appt to 01/21/23.Requested Dr Melvyn Neth.

## 2023-01-16 IMAGING — CR DG SHOULDER 2+V*L*
3 series · 3 of 3 positions shown · non-contrast
Comparison: None.

CLINICAL DATA: Fall, left arm pain, limited range of motion,
initial encounter.

EXAM:
LEFT SHOULDER - 2+ VIEW

[w shoulder grashey left *]
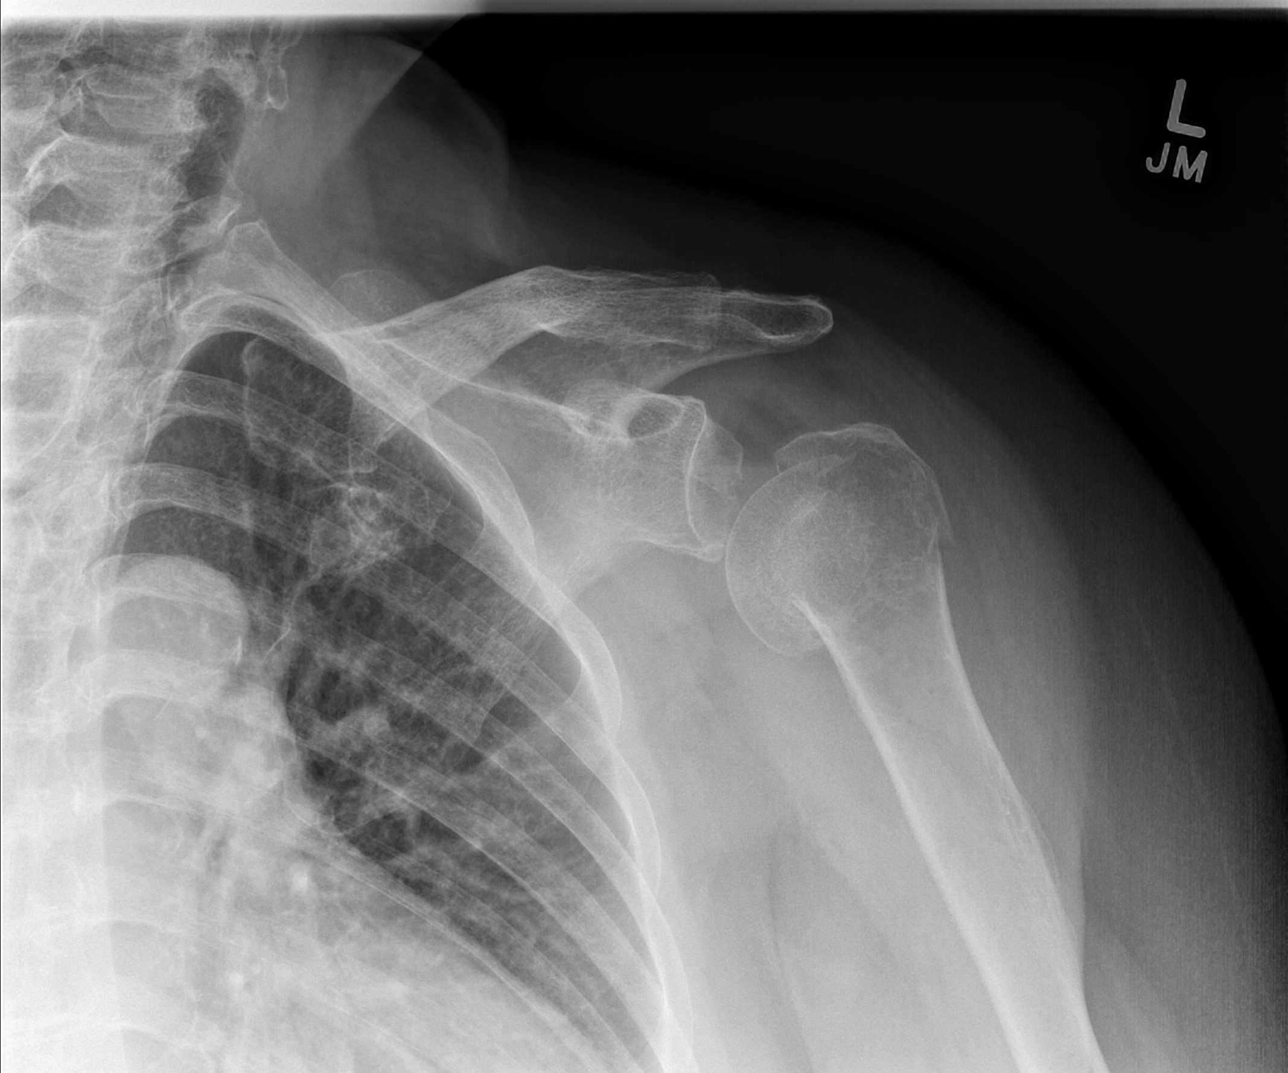

[w shoulder y view left *]
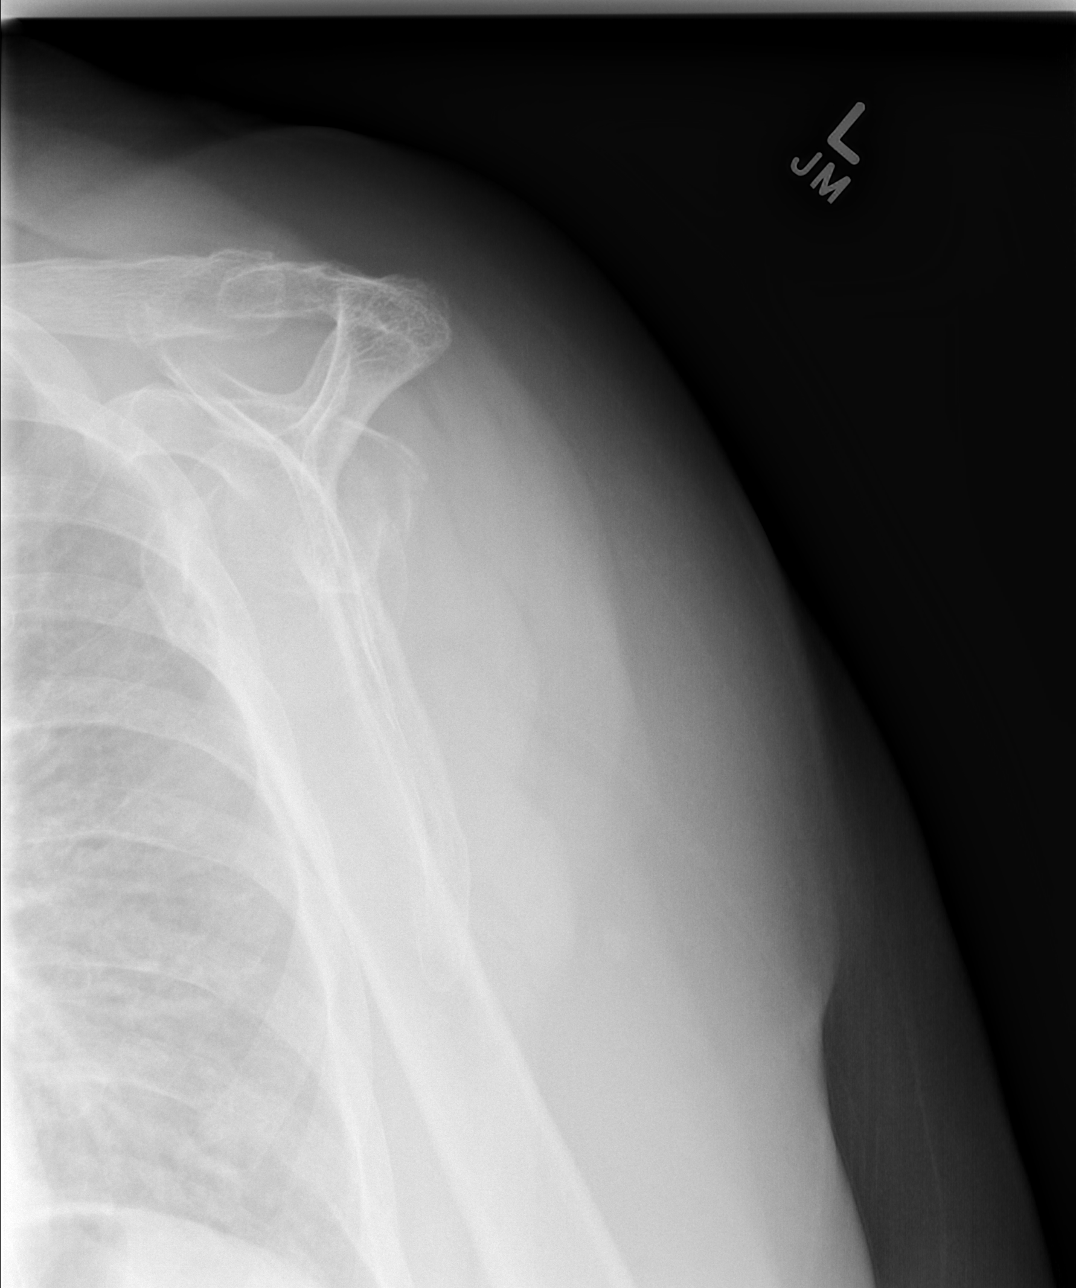

[w shoulder ap external left *]
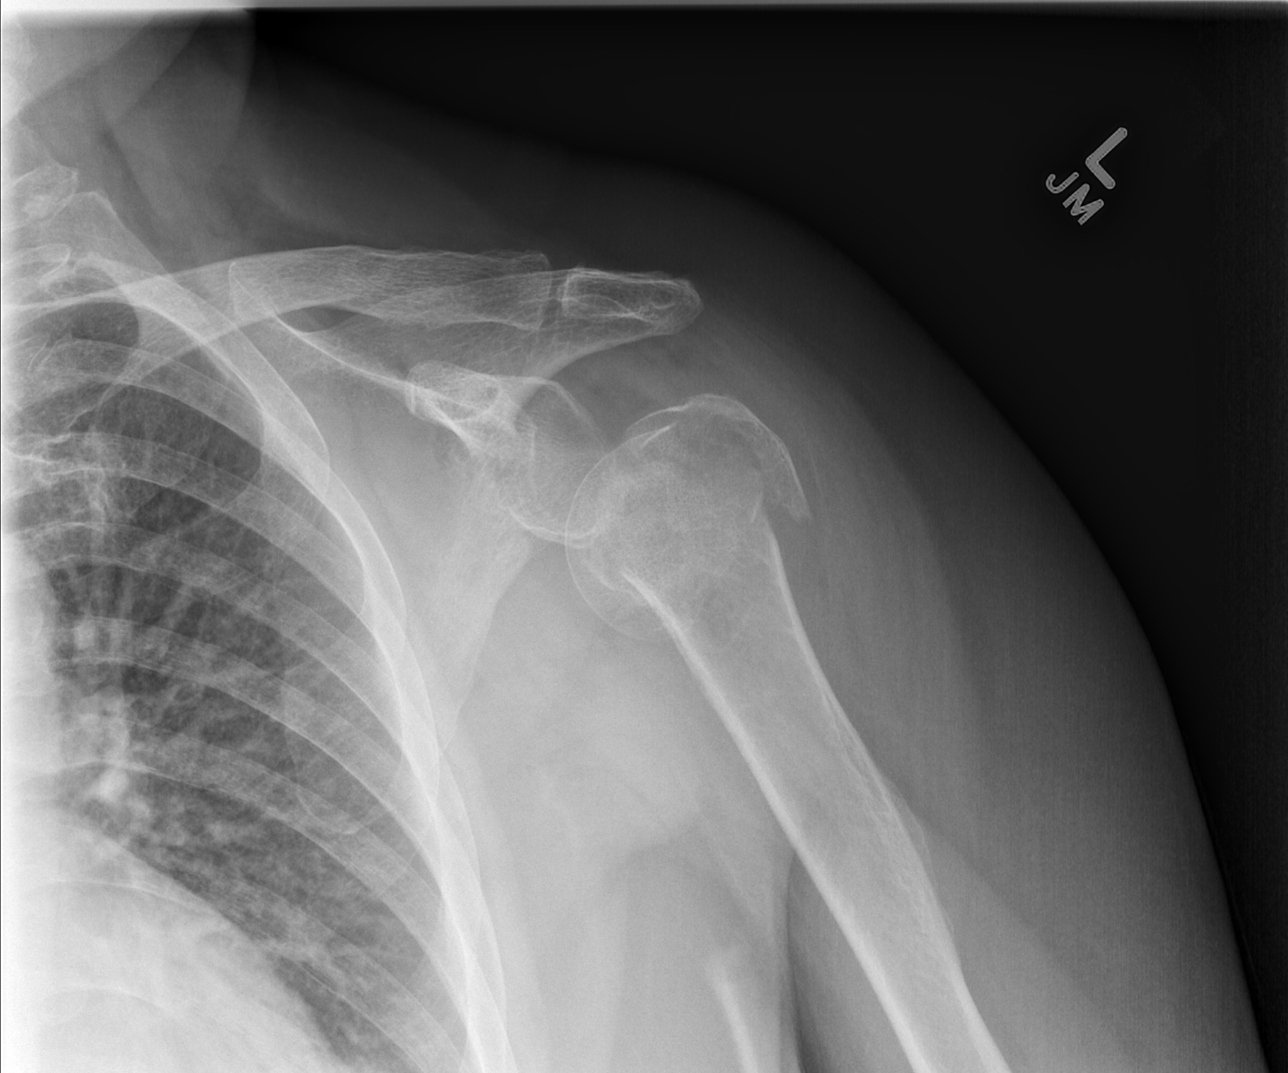

[3 of 3 positions shown; findings below may reference images not displayed]

FINDINGS: There is a comminuted fracture of the humeral head and neck with
impaction and slight displacement of the greater tuberosity. A
triangular lucency is seen along the superior aspect of the
glenohumeral joint, suggesting a lipohemarthrosis. Difficult to
exclude an associated fracture of the glenoid. Acromioclavicular
joint is intact. Visualized left chest is grossly unremarkable.
IMPRESSION: 1. Comminuted and impacted fracture of the humeral head/neck with a
probable associated lipohemarthrosis.
2. Difficult to exclude an associated fracture of the glenoid.

## 2023-01-16 IMAGING — CT CT SHOULDER*R* W/O CM
1 of 3 series · 3 of 14 positions shown, 4 images · non-contrast
Comparison: X-ray 09/18/2020

CLINICAL DATA: Proximal right humeral fracture

EXAM:
CT OF THE UPPER RIGHT EXTREMITY WITHOUT CONTRAST
TECHNIQUE: Multidetector CT imaging of the upper right extremity was performed
according to the standard protocol.

[Series 7: ax st · oblique · 0.42mm/px · 3 of 124 slices shown, 4 images]
[im 31/124  soft-tissue]
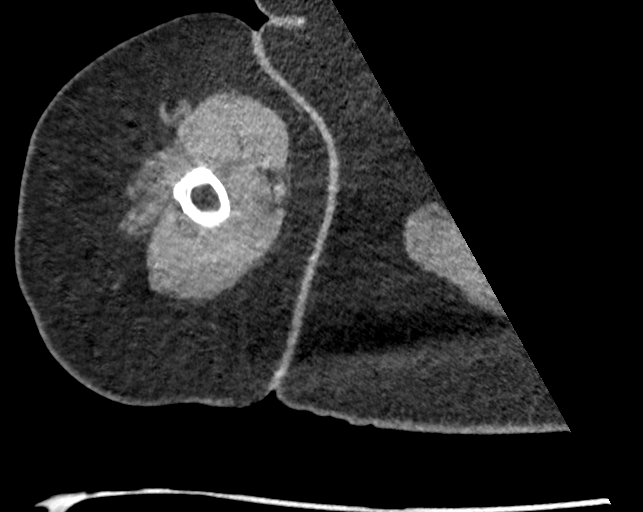
[im 31/124  bone]
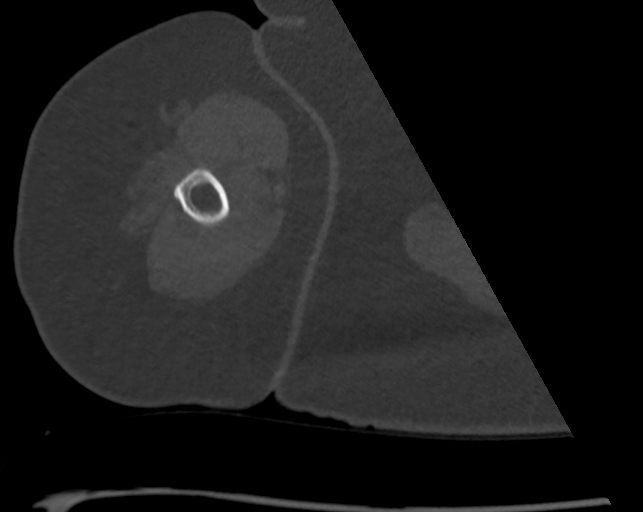
[im 62/124  bone]
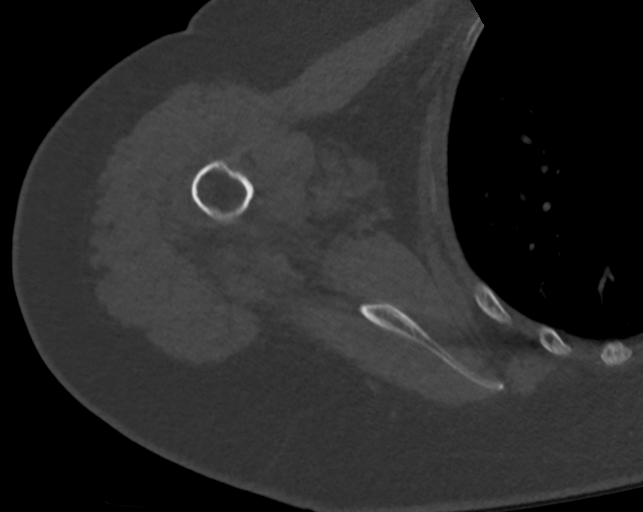
[im 93/124  bone]
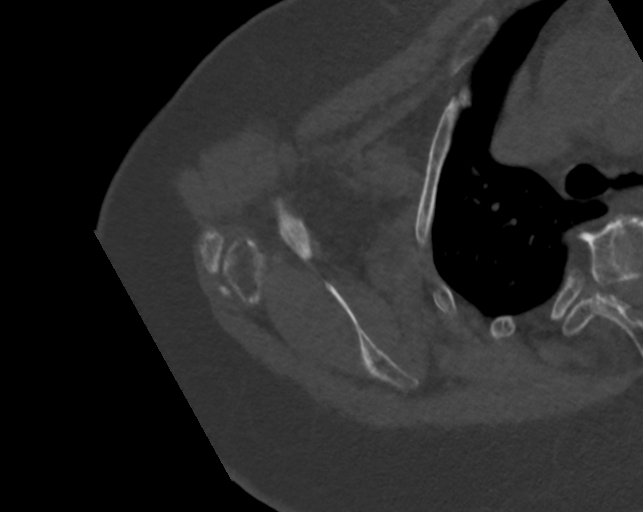

[3 of 14 positions shown; findings below may reference images not displayed]

FINDINGS: Bones/Joint/Cartilage

Comminuted fracture of the proximal right humerus with minimally
impacted fracture through the anatomic neck (series 6, image 51).
There is also a transversely oriented component through the surgical
neck. Mildly displaced greater tuberosity avulsion fracture. No
fracture involvement of the lesser tuberosity. No evidence of
articular surface involvement. Glenohumeral joint alignment is
maintained without dislocation. Glenohumeral joint space maintained.
Moderate arthropathy of the AC joint.

Ligaments

Suboptimally assessed by CT.

Muscles and Tendons

Preserved bulk of the rotator cuff musculature without atrophy or
fatty infiltration. Rotator cuff osseous avulsion of the greater
tuberosity.

Soft tissues

Mild soft tissue edema surrounds the fracture site. No organized
fluid collection or hematoma. Visualized right lung field is clear.
IMPRESSION: 1. Acute comminuted, mildly displaced fracture of the right humeral
head and neck.
2. Rotator cuff osseous avulsion of the greater tuberosity.

## 2023-01-16 IMAGING — CT CT SHOULDER*L* W/O CM
1 of 3 series · 14 of 27 positions shown, 15 images · non-contrast
Comparison: Plain films left shoulder earlier today.

CLINICAL DATA: The patient suffered a left shoulder fracture in a
fall today. Initial encounter.

EXAM:
CT OF THE UPPER LEFT EXTREMITY WITHOUT CONTRAST
TECHNIQUE: Multidetector CT imaging of the upper left extremity was performed
according to the standard protocol.

[Series 7: ax st · sagittal · 0.47mm/px · 14 of 127 slices shown, 15 images]
[im 39/127  bone]
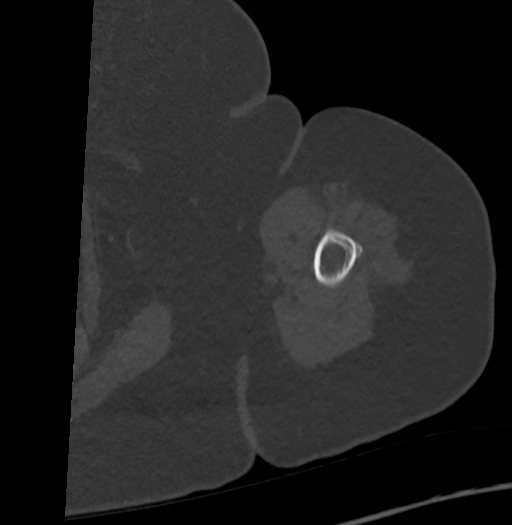
[im 43/127  bone]
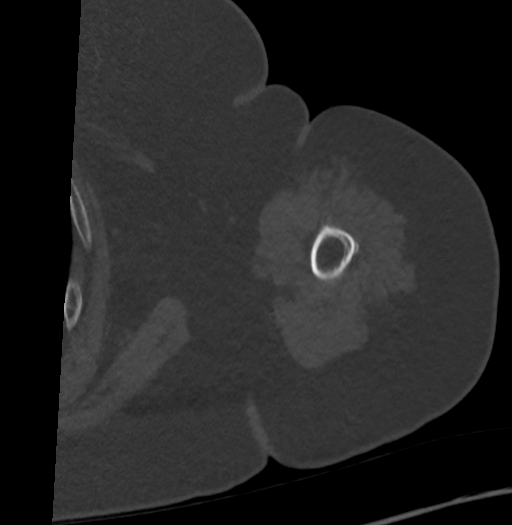
[im 47/127  bone]
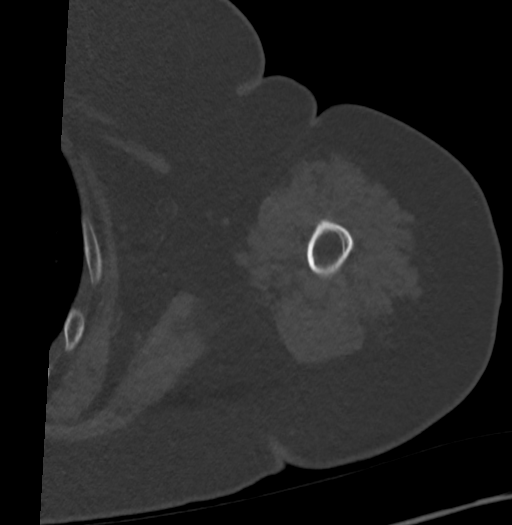
[im 51/127  bone]
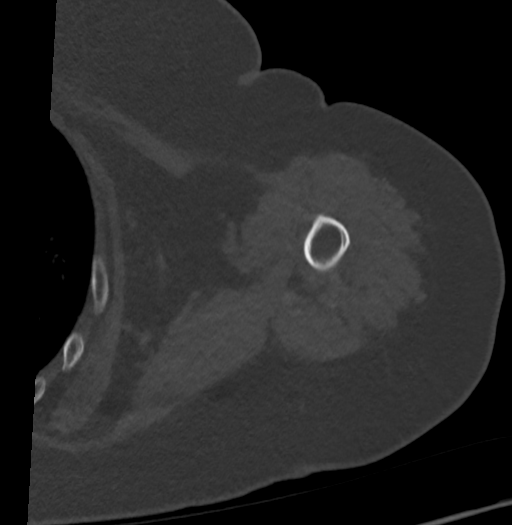
[im 55/127  bone]
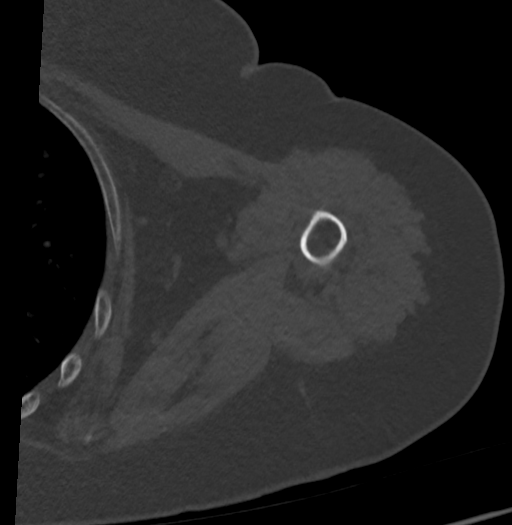
[im 59/127  bone]
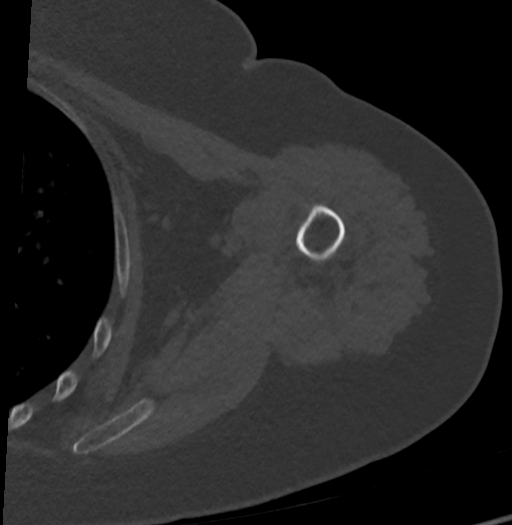
[im 64/127  bone]
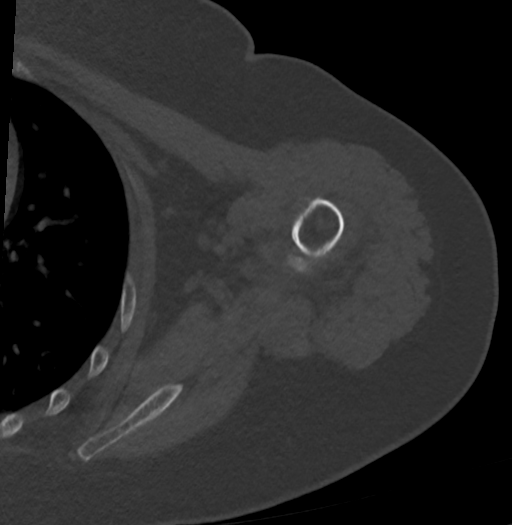
[im 68/127  bone]
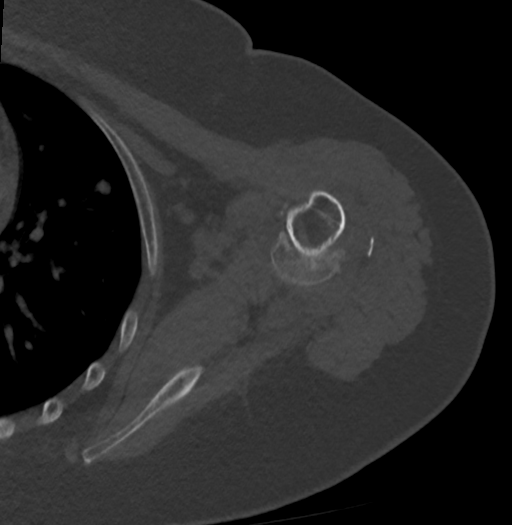
[im 72/127  bone]
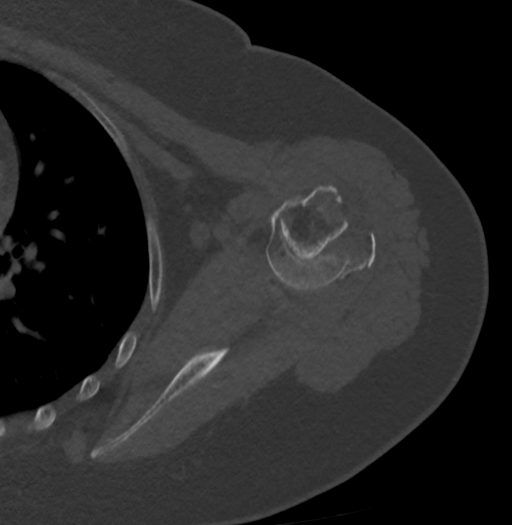
[im 76/127  bone]
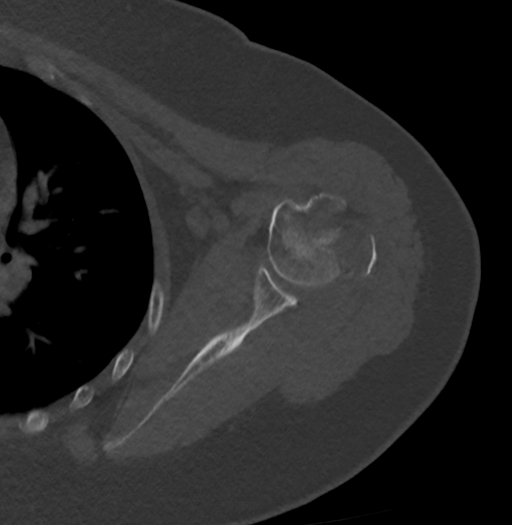
[im 80/127  bone]
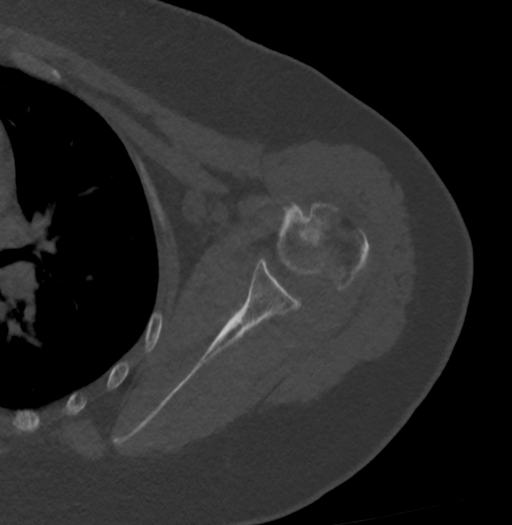
[im 84/127  bone]
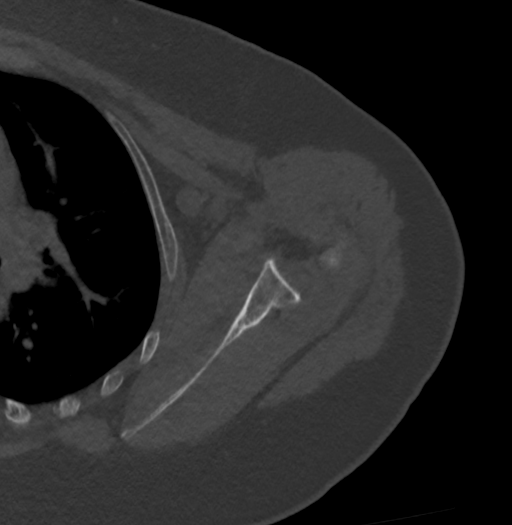
[im 85/127  soft-tissue]
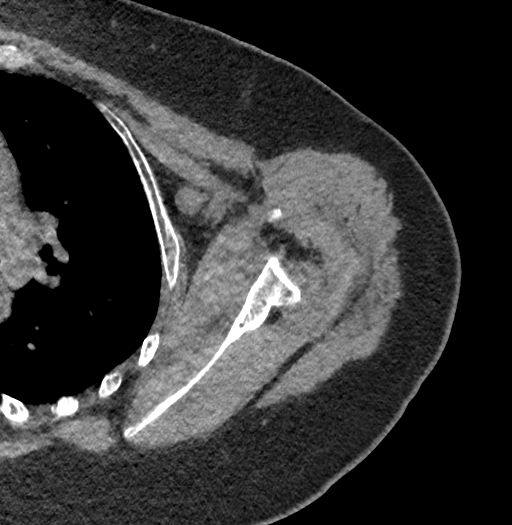
[im 85/127  bone]
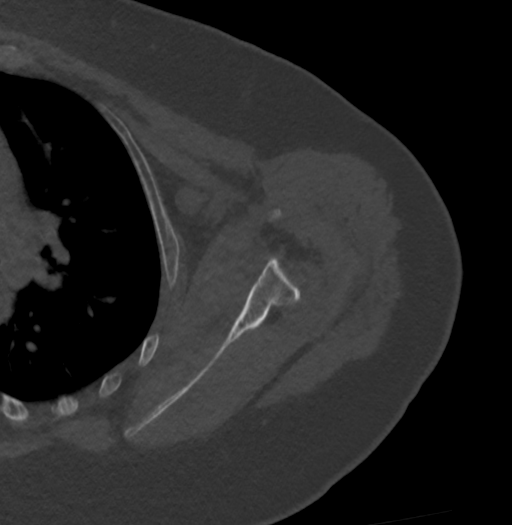
[im 89/127  bone]
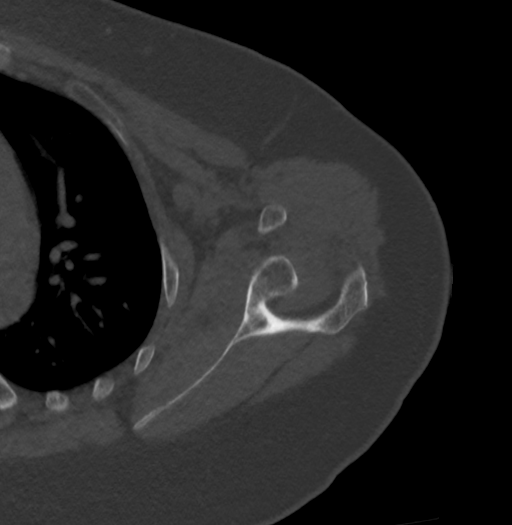

[14 of 27 positions shown; findings below may reference images not displayed]

FINDINGS: Bones/Joint/Cartilage

As seen on the comparison plain films, the patient has an impacted
surgical neck fracture of the humerus. There is fragment override
medially of approximately 1.5 cm and approximately [DATE] shaft width
anterior displacement of the shaft of the humerus. The fracture
involves the greater tuberosity which is mildly comminuted. The
greater tuberosity is superiorly displaced up to 1.2 cm. The lesser
tuberosity is spared. No other fracture is identified.

The humeral head is located and the acromioclavicular joint is
intact. There is mild acromioclavicular osteoarthritis. The acromion
is type 1.

Ligaments

Suboptimally assessed by CT.

Muscles and Tendons

As visualized by CT scan, the rotator cuff appears intact. No muscle
atrophy is identified.

Soft tissues

Imaged lung parenchyma is clear with centrilobular emphysema noted.
IMPRESSION: Impacted anterior lead displaced surgical neck fracture of the left
humerus involves the greater tuberosity as described above.

## 2023-01-20 ENCOUNTER — Other Ambulatory Visit: Payer: Medicare HMO

## 2023-01-20 ENCOUNTER — Encounter: Payer: Medicare HMO | Admitting: Oncology

## 2023-01-20 NOTE — Progress Notes (Unsigned)
[facility]  [address] [place],  [place]  [address] [phone]  Clinic Day:  01/20/2023  Referring physician: Paulina Fusi, MD   HISTORY OF PRESENT ILLNESS:  The patient is a 67 y.o. female  *** who I was asked to consult upon for ***   PAST MEDICAL HISTORY:   Past Medical History:  Diagnosis Date  . Anxiety   . Back pain   . Complication of anesthesia   . GERD (gastroesophageal reflux disease)   . Hypertension   . PONV (postoperative nausea and vomiting)   . Pre-diabetes   . Spinal stenosis     PAST SURGICAL HISTORY:   Past Surgical History:  Procedure Laterality Date  . COLONOSCOPY    . REVERSE SHOULDER ARTHROPLASTY Left 09/26/2020   Procedure: REVERSE SHOULDER ARTHROPLASTY;  Surgeon: Yolonda Kida, MD;  Location: [facility] OR;  Service: Orthopedics;  Laterality: Left;  2.5 HRS    CURRENT MEDICATIONS:   Current Outpatient Medications  Medication Sig Dispense Refill  . acetaminophen (TYLENOL) 500 MG tablet Take 1,000 mg by mouth every 6 (six) hours as needed (pain).    Marland Kitchen ibuprofen (ADVIL) 200 MG tablet Take 400-800 mg by mouth every 8 (eight) hours as needed (pain.).    Marland Kitchen ketotifen (ZADITOR) 0.025 % ophthalmic solution Place 1 drop into both eyes 2 (two) times daily as needed (allergy/eye irritation.).    Marland Kitchen lisinopril (ZESTRIL) 5 MG tablet Take 5 mg by mouth at bedtime.    . methocarbamol (ROBAXIN) 500 MG tablet Take 500 mg by mouth in the morning, at noon, and at bedtime.    . methocarbamol (ROBAXIN) 500 MG tablet Take 1 tablet (500 mg total) by mouth every 8 (eight) hours as needed for muscle spasms. 40 tablet 1  . NYSTATIN powder Apply 1 application topically 3 (three) times daily as needed (skin irritation/rash).    Marland Kitchen omeprazole (PRILOSEC) 20 MG capsule Take 20 mg by mouth daily as needed (acid reflux/indigestion).    Marland Kitchen oxyCODONE (ROXICODONE) 5 MG immediate release tablet Take 1 tablet (5 mg total) by mouth every 4  (four) hours as needed for up to 30 doses for severe pain. 30 tablet 0  . polyethylene glycol (MIRALAX / GLYCOLAX) 17 g packet Take 17 g by mouth daily as needed (constipation).     No current facility-administered medications for this visit.    ALLERGIES:   Allergies  Allergen Reactions  . Other     Tolerates Prednisone in small doses, in higher doses cause palpitations    FAMILY HISTORY:  No family history on file.  SOCIAL HISTORY:   reports that she has never smoked. She has never used smokeless tobacco. She reports that she does not currently use alcohol. She reports that she does not currently use drugs.  REVIEW OF SYSTEMS:  Review of Systems - Oncology   PHYSICAL EXAM:  There were no vitals taken for this visit. Wt Readings from Last 3 Encounters:  09/26/20 220 lb 0.3 oz (99.8 kg)  09/18/20 220 lb (99.8 kg)   There is no height or weight on file to calculate BMI. Performance status (ECOG): {CHL ONC [id number] Physical Exam  LABS:      Latest Ref Rng & Units 10/03/2020    5:26 PM 09/28/2020    4:41 AM 09/27/2020    2:53 AM  CBC  WBC 4.0 - 10.5 K/uL 4.3  11.3  5.4   Hemoglobin 12.0 - 15.0 g/dL 9.7  9.6  9.1   Hematocrit 36.0 - 46.0 % 28.2  26.7  25.7   Platelets 150 - 400 K/uL PLATELET CLUMPS NOTED ON SMEAR, UNABLE TO ESTIMATE  94  83       Latest Ref Rng & Units 10/03/2020    5:26 PM 10/03/2020    2:14 AM 09/27/2020    2:53 AM  CMP  Glucose 70 - 99 mg/dL 161   096   BUN 8 - 23 mg/dL 10   18   Creatinine 0.45 - 1.00 mg/dL 4.09  8.11  9.14   Sodium 135 - 145 mmol/L 135   131   Potassium 3.5 - 5.1 mmol/L 4.4   4.7   Chloride 98 - 111 mmol/L 107   106   CO2 22 - 32 mmol/L 21   20   Calcium 8.9 - 10.3 mg/dL 78.2   9.5   Total Protein 6.5 - 8.1 g/dL 5.2     Total Bilirubin 0.3 - 1.2 mg/dL 2.1     Alkaline Phos 38 - 126 U/L 129     AST 15 - 41 U/L 57     ALT 0 - 44 U/L 33        No results found for: "CEA1", "CEA" / No results found for: "CEA1",  "CEA" No results found for: "PSA1" No results found for: "NFA213" No results found for: "CAN125"  No results found for: "TOTALPROTELP", "ALBUMINELP", "A1GS", "A2GS", "BETS", "BETA2SER", "GAMS", "MSPIKE", "SPEI" No results found for: "TIBC", "FERRITIN", "IRONPCTSAT" No results found for: "LDH"  No results found for: "AFPTUMOR", "TOTALPROTELP", "ALBUMINELP", "A1GS", "A2GS", "BETS", "BETA2SER", "GAMS", "MSPIKE", "SPEI", "LDH", "CEA1", "CEA", "PSA1", "IGASERUM", "IGGSERUM", "IGMSERUM", "THGAB", "THYROGLB"  Review Flowsheet        No data to display          STUDIES:  No results found.   ASSESSMENT & PLAN:  A 67 y.o. female who I was asked to consult upon for *** .The patient understands all the plans discussed today and is in agreement with them.  I do appreciate Paulina Fusi, MD for his new consult.   Ruwayda Curet Kirby Funk, MD

## 2023-01-21 ENCOUNTER — Inpatient Hospital Stay: Payer: Medicare HMO

## 2023-01-21 ENCOUNTER — Encounter: Payer: Self-pay | Admitting: Oncology

## 2023-01-21 ENCOUNTER — Inpatient Hospital Stay: Payer: Medicare HMO | Attending: Oncology | Admitting: Oncology

## 2023-01-21 ENCOUNTER — Telehealth: Payer: Self-pay

## 2023-01-21 ENCOUNTER — Other Ambulatory Visit: Payer: Self-pay | Admitting: Oncology

## 2023-01-21 VITALS — BP 178/75 | HR 100 | Temp 97.4°F | Resp 16 | Ht 61.5 in | Wt 227.4 lb

## 2023-01-21 DIAGNOSIS — D649 Anemia, unspecified: Secondary | ICD-10-CM | POA: Diagnosis not present

## 2023-01-21 DIAGNOSIS — D696 Thrombocytopenia, unspecified: Secondary | ICD-10-CM

## 2023-01-21 DIAGNOSIS — Z803 Family history of malignant neoplasm of breast: Secondary | ICD-10-CM | POA: Diagnosis not present

## 2023-01-21 DIAGNOSIS — R748 Abnormal levels of other serum enzymes: Secondary | ICD-10-CM | POA: Diagnosis not present

## 2023-01-21 LAB — CBC AND DIFFERENTIAL
HCT: 40 (ref 36–46)
Hemoglobin: 14.1 (ref 12.0–16.0)
Neutrophils Absolute: 5.08
Platelets: 84 10*3/uL — AB (ref 150–400)
WBC: 8.2

## 2023-01-21 LAB — CMP (CANCER CENTER ONLY)
ALT: 55 U/L — ABNORMAL HIGH (ref 0–44)
AST: 54 U/L — ABNORMAL HIGH (ref 15–41)
Albumin: 3.2 g/dL — ABNORMAL LOW (ref 3.5–5.0)
Alkaline Phosphatase: 84 U/L (ref 38–126)
Anion gap: 6 (ref 5–15)
BUN: 12 mg/dL (ref 8–23)
CO2: 24 mmol/L (ref 22–32)
Calcium: 10.5 mg/dL — ABNORMAL HIGH (ref 8.9–10.3)
Chloride: 108 mmol/L (ref 98–111)
Creatinine: 0.53 mg/dL (ref 0.44–1.00)
GFR, Estimated: >60 mL/min (ref >60)
Glucose, Bld: 145 mg/dL — ABNORMAL HIGH (ref 70–99)
Potassium: 3.9 mmol/L (ref 3.5–5.1)
Sodium: 138 mmol/L (ref 135–145)
Total Bilirubin: 3.7 mg/dL (ref 0.3–1.2)
Total Protein: 6.1 g/dL — ABNORMAL LOW (ref 6.5–8.1)

## 2023-01-21 LAB — CBC: RBC: 4.01 (ref 3.87–5.11)

## 2023-01-21 NOTE — Telephone Encounter (Signed)
She recently had vitamin B12 and folate levels checked at her primary care office, which came back normal at 615 and 14.1, respectively.  As mentioned previously, her thrombocytopenia, although new to her, has been present for at least the past 2 years.  As it is stable at 84 today, her thrombocytopenia will continue to be followed conservatively.  I will see her back in 4 months for repeat clinical assessment.  The patient understands all the plans discussed today and is in agreement with them.   I do appreciate Paulina Fusi, MD for his new consult.    Weston Settle, MD                Electronically signed by Weston Settle, MD at 01/21/2023  2:08 PM

## 2023-01-21 NOTE — Progress Notes (Signed)
CRITICAL VALUE STICKER  CRITICAL VALUE:  Total Bilirubin 3.7  RECEIVER (on-site recipient of call):  Dyane Dustman RN  DATE & TIME NOTIFIED:   01/21/2023 @ 1407  MESSENGER (representative from lab):  [facility]  MD NOTIFIED:   Dr. Melvyn Neth  TIME OF NOTIFICATION:    1435  RESPONSE:  Referring patient to Liver Specialist.

## 2023-01-22 ENCOUNTER — Telehealth: Payer: Self-pay | Admitting: Oncology

## 2023-01-22 NOTE — Telephone Encounter (Signed)
01/22/23 Spoke with patient and confimed next appt on 05/23/23.

## 2023-02-05 DIAGNOSIS — Z1231 Encounter for screening mammogram for malignant neoplasm of breast: Secondary | ICD-10-CM | POA: Diagnosis not present

## 2023-02-13 DIAGNOSIS — H25043 Posterior subcapsular polar age-related cataract, bilateral: Secondary | ICD-10-CM | POA: Diagnosis not present

## 2023-02-13 DIAGNOSIS — H2513 Age-related nuclear cataract, bilateral: Secondary | ICD-10-CM | POA: Diagnosis not present

## 2023-02-13 DIAGNOSIS — H40013 Open angle with borderline findings, low risk, bilateral: Secondary | ICD-10-CM | POA: Diagnosis not present

## 2023-02-13 DIAGNOSIS — H2512 Age-related nuclear cataract, left eye: Secondary | ICD-10-CM | POA: Diagnosis not present

## 2023-02-13 DIAGNOSIS — H18413 Arcus senilis, bilateral: Secondary | ICD-10-CM | POA: Diagnosis not present

## 2023-03-13 DIAGNOSIS — D696 Thrombocytopenia, unspecified: Secondary | ICD-10-CM | POA: Diagnosis not present

## 2023-03-13 DIAGNOSIS — K760 Fatty (change of) liver, not elsewhere classified: Secondary | ICD-10-CM | POA: Diagnosis not present

## 2023-03-13 DIAGNOSIS — Z139 Encounter for screening, unspecified: Secondary | ICD-10-CM | POA: Diagnosis not present

## 2023-03-13 DIAGNOSIS — R7301 Impaired fasting glucose: Secondary | ICD-10-CM | POA: Diagnosis not present

## 2023-03-13 DIAGNOSIS — K219 Gastro-esophageal reflux disease without esophagitis: Secondary | ICD-10-CM | POA: Diagnosis not present

## 2023-03-13 DIAGNOSIS — M793 Panniculitis, unspecified: Secondary | ICD-10-CM | POA: Diagnosis not present

## 2023-03-13 DIAGNOSIS — H6122 Impacted cerumen, left ear: Secondary | ICD-10-CM | POA: Diagnosis not present

## 2023-03-13 DIAGNOSIS — I10 Essential (primary) hypertension: Secondary | ICD-10-CM | POA: Diagnosis not present

## 2023-03-13 DIAGNOSIS — E785 Hyperlipidemia, unspecified: Secondary | ICD-10-CM | POA: Diagnosis not present

## 2023-03-19 DIAGNOSIS — H40013 Open angle with borderline findings, low risk, bilateral: Secondary | ICD-10-CM | POA: Diagnosis not present

## 2023-03-19 DIAGNOSIS — H25813 Combined forms of age-related cataract, bilateral: Secondary | ICD-10-CM | POA: Diagnosis not present

## 2023-03-19 DIAGNOSIS — H2511 Age-related nuclear cataract, right eye: Secondary | ICD-10-CM | POA: Diagnosis not present

## 2023-03-20 DIAGNOSIS — H2511 Age-related nuclear cataract, right eye: Secondary | ICD-10-CM | POA: Diagnosis not present

## 2023-04-02 DIAGNOSIS — H2512 Age-related nuclear cataract, left eye: Secondary | ICD-10-CM | POA: Diagnosis not present

## 2023-04-24 DIAGNOSIS — K766 Portal hypertension: Secondary | ICD-10-CM | POA: Diagnosis not present

## 2023-04-24 DIAGNOSIS — K7469 Other cirrhosis of liver: Secondary | ICD-10-CM | POA: Diagnosis not present

## 2023-04-24 DIAGNOSIS — Z1159 Encounter for screening for other viral diseases: Secondary | ICD-10-CM | POA: Diagnosis not present

## 2023-04-24 DIAGNOSIS — K7581 Nonalcoholic steatohepatitis (NASH): Secondary | ICD-10-CM | POA: Diagnosis not present

## 2023-04-24 DIAGNOSIS — R945 Abnormal results of liver function studies: Secondary | ICD-10-CM | POA: Diagnosis not present

## 2023-05-06 DIAGNOSIS — K7689 Other specified diseases of liver: Secondary | ICD-10-CM | POA: Diagnosis not present

## 2023-05-06 DIAGNOSIS — K8020 Calculus of gallbladder without cholecystitis without obstruction: Secondary | ICD-10-CM | POA: Diagnosis not present

## 2023-05-06 DIAGNOSIS — K7469 Other cirrhosis of liver: Secondary | ICD-10-CM | POA: Diagnosis not present

## 2023-05-06 DIAGNOSIS — K7581 Nonalcoholic steatohepatitis (NASH): Secondary | ICD-10-CM | POA: Diagnosis not present

## 2023-05-06 DIAGNOSIS — K7460 Unspecified cirrhosis of liver: Secondary | ICD-10-CM | POA: Diagnosis not present

## 2023-05-23 ENCOUNTER — Ambulatory Visit: Payer: Medicare HMO | Admitting: Oncology

## 2023-05-23 ENCOUNTER — Other Ambulatory Visit: Payer: Medicare HMO

## 2023-06-05 NOTE — Progress Notes (Deleted)
[facility]  [address] [place],  [place]  [address] [phone]  Clinic Day:  06/05/2023  Referring physician: Paulina Fusi, MD   HISTORY OF PRESENT ILLNESS:  The patient is a 68 y.o. female  who I was asked to consult upon for thrombocytopenia.  Recent labs showed a low platelet count of 92.  However,  a note stated that platelet agglutination was seen, which may have led to an undercounting of her platelets.  Of note, CBCs dating back for the past 2 years have shown a platelet count in the 80's-90's, with platelet clumping previously seen with many of these studies.  This patient does have elevated liver parameters, including a bilirubin of 2.8, an alkaline phosphatase of 155, an AST of 80 and an ALT of 81.  Furthermore, a recent abdominal CT scan showed mild cirrhosis secondary to fatty liver disease.  According to the patient, she has not been told before of having low platelets.  She denies having any subcutaneous bleeding issues which ever alerted her to having severe thrombocytopenia.  She does have thin, friable skin on her bilateral forearms, but nowhere else.  She denies being placed on any new medicines which have the ability to cause thrombocytopenia via bone marrow suppression.  To her knowledge, there is no family history of thrombocytopenia or other hematologic disorders.  Overall, there have been no particular changes in her health over these past handful of months.    PAST MEDICAL HISTORY:   Past Medical History:  Diagnosis Date   Anxiety    Arthritis    Back pain    BPPV (benign paroxysmal positional vertigo)    Complication of anesthesia    DDD (degenerative disc disease), lumbar    Eczema    External hemorrhoids    GERD (gastroesophageal reflux disease)    Hyperlipidemia    Hypertension    IDA (iron deficiency anemia)    Liver cirrhosis secondary to NASH (HCC)    Lumbar radiculopathy    Osteoporosis    Palpitations     PONV (postoperative nausea and vomiting)    Pre-diabetes    Psoriasis    Sinus tachycardia    Spinal stenosis    Thrombocytopenia (HCC)     PAST SURGICAL HISTORY:   Past Surgical History:  Procedure Laterality Date   COLONOSCOPY     WITH POLYPECTOMY   REVERSE SHOULDER ARTHROPLASTY Left 09/26/2020   Procedure: REVERSE SHOULDER ARTHROPLASTY;  Surgeon: Yolonda Kida, MD;  Location: [facility] OR;  Service: Orthopedics;  Laterality: Left;  2.5 HRS    CURRENT MEDICATIONS:   Current Outpatient Medications  Medication Sig Dispense Refill   acetaminophen (TYLENOL) 500 MG tablet Take 1,000 mg by mouth every 6 (six) hours as needed (pain).     clobetasol cream (TEMOVATE) 0.05 % Apply topically.     clotrimazole-betamethasone (LOTRISONE) cream Apply topically.     ibuprofen (ADVIL) 200 MG tablet Take 400-800 mg by mouth every 8 (eight) hours as needed (pain.).     ketotifen (ZADITOR) 0.025 % ophthalmic solution Place 1 drop into both eyes 2 (two) times daily as needed (allergy/eye irritation.).     lisinopril (ZESTRIL) 5 MG tablet Take 5 mg by mouth at bedtime.     loratadine (CLARITIN) 10 MG tablet Take 10 mg by mouth 2 (two) times daily.     methocarbamol (ROBAXIN) 500 MG tablet Take 500 mg by mouth in the morning, at noon, and at bedtime.  methocarbamol (ROBAXIN) 500 MG tablet Take 1 tablet (500 mg total) by mouth every 8 (eight) hours as needed for muscle spasms. 40 tablet 1   NYSTATIN powder Apply 1 application topically 3 (three) times daily as needed (skin irritation/rash).     omeprazole (PRILOSEC) 20 MG capsule Take 20 mg by mouth daily as needed (acid reflux/indigestion).     oxyCODONE (ROXICODONE) 5 MG immediate release tablet Take 1 tablet (5 mg total) by mouth every 4 (four) hours as needed for up to 30 doses for severe pain. 30 tablet 0   polyethylene glycol (MIRALAX / GLYCOLAX) 17 g packet Take 17 g by mouth daily as needed (constipation).     No current  facility-administered medications for this visit.    ALLERGIES:   Allergies  Allergen Reactions   Other     Tolerates Prednisone in small doses, in higher doses cause palpitations    FAMILY HISTORY:   Family History  Problem Relation Age of Onset   Liver cancer Mother    Diabetes Mother    Heart disease Father    Heart failure Father    Heart attack Sister    Diabetes Sister    CVA Brother    Diabetes Brother    Diabetes Brother    Dementia Brother    Breast cancer Paternal Aunt    Breast cancer Paternal Aunt    Breast cancer Niece    SOCIAL HISTORY:  The patient was born and raised in [place].  She currently lives in the [place] community.  She is widowed; she was previously married for 24 years.  She has 2 children and 4 grandchildren.  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  There is no history of alcoholism or tobacco abuse.  REVIEW OF SYSTEMS:  Review of Systems  Constitutional:  Positive for fatigue. Negative for fever.  HENT:   Negative for hearing loss and sore throat.   Eyes:  Positive for eye problems.  Respiratory:  Negative for chest tightness, cough and hemoptysis.   Cardiovascular:  Negative for chest pain and palpitations.  Gastrointestinal:  Positive for constipation. Negative for abdominal distention, abdominal pain, blood in stool, diarrhea, nausea and vomiting.  Endocrine: Negative for hot flashes.  Genitourinary:  Negative for difficulty urinating, dysuria, frequency, hematuria and nocturia.   Musculoskeletal:  Positive for arthralgias. Negative for back pain, gait problem and myalgias.  Skin: Negative.  Negative for itching and rash.  Neurological: Negative.  Negative for dizziness, extremity weakness, gait problem, headaches, light-headedness and numbness.  Hematological: Negative.   Psychiatric/Behavioral: Negative.  Negative for depression and suicidal ideas. The patient is not nervous/anxious.     PHYSICAL EXAM:  There were no vitals  taken for this visit. Wt Readings from Last 3 Encounters:  01/21/23 227 lb 6.4 oz (103.1 kg)  09/26/20 220 lb 0.3 oz (99.8 kg)  09/18/20 220 lb (99.8 kg)   There is no height or weight on file to calculate BMI. Performance status (ECOG): 0 - Asymptomatic Physical Exam Constitutional:      Appearance: Normal appearance. She is obese. She is not ill-appearing.  HENT:     Mouth/Throat:     Mouth: Mucous membranes are moist.     Pharynx: Oropharynx is clear. No oropharyngeal exudate or posterior oropharyngeal erythema.  Cardiovascular:     Rate and Rhythm: Normal rate and regular rhythm.     Heart sounds: No murmur heard.    No friction rub. No gallop.  Pulmonary:  Effort: Pulmonary effort is normal. No respiratory distress.     Breath sounds: Normal breath sounds. No wheezing, rhonchi or rales.  Abdominal:     General: Bowel sounds are normal. There is no distension.     Palpations: Abdomen is soft. There is no mass.     Tenderness: There is no abdominal tenderness.  Musculoskeletal:        General: No swelling.     Right lower leg: No edema.     Left lower leg: No edema.  Lymphadenopathy:     Cervical: No cervical adenopathy.     Upper Body:     Right upper body: No supraclavicular or axillary adenopathy.     Left upper body: No supraclavicular or axillary adenopathy.     Lower Body: No right inguinal adenopathy. No left inguinal adenopathy.  Skin:    General: Skin is warm.     Coloration: Skin is not jaundiced.     Findings: No lesion or rash.  Neurological:     General: No focal deficit present.     Mental Status: She is alert and oriented to person, place, and time. Mental status is at baseline.  Psychiatric:        Mood and Affect: Mood normal.        Behavior: Behavior normal.        Thought Content: Thought content normal.    LABS:       Latest Ref Rng & Units 01/21/2023   12:00 AM 10/03/2020    5:26 PM 09/28/2020    4:41 AM  CBC  WBC  8.2     4.3  11.3    Hemoglobin 12.0 - 16.0 14.1     9.7  9.6   Hematocrit 36 - 46 40     28.2  26.7   Platelets 150 - 400 K/uL 84     PLATELET CLUMPS NOTED ON SMEAR, UNABLE TO ESTIMATE  94      This result is from an external source.      Latest Ref Rng & Units 01/21/2023   10:38 AM 10/03/2020    5:26 PM 10/03/2020    2:14 AM  CMP  Glucose 70 - 99 mg/dL 045  409    BUN 8 - 23 mg/dL 12  10    Creatinine 8.11 - 1.00 mg/dL 9.14  7.82  9.56   Sodium 135 - 145 mmol/L 138  135    Potassium 3.5 - 5.1 mmol/L 3.9  4.4    Chloride 98 - 111 mmol/L 108  107    CO2 22 - 32 mmol/L 24  21    Calcium 8.9 - 10.3 mg/dL 21.3  08.6    Total Protein 6.5 - 8.1 g/dL 6.1  5.2    Total Bilirubin 0.3 - 1.2 mg/dL 3.7  2.1    Alkaline Phos 38 - 126 U/L 84  129    AST 15 - 41 U/L 54  57    ALT 0 - 44 U/L 55  33      Latest Reference Range & Units 10/03/20 17:26  Prothrombin Time 11.4 - 15.2 seconds 15.9 (H)  INR 0.8 - 1.2  1.3 (H)  (H): Data is abnormally high   STUDIES:  CT scans of her abdomen/pelvis in August 2024 revealed the following:  FINDINGS: Lower chest: No acute abnormality  Hepatobiliary: Gallbladder filled with small gallstones. No biliary ductal dilatation. No focal hepatic abnormality. Subtle nodularity to the liver surface suggests early  cirrhosis.  Pancreas: No focal abnormality or ductal dilatation.  Spleen: No focal abnormality. Normal size.  Adrenals/Urinary Tract: No renal or adrenal mass. No stones or hydronephrosis.  Stomach/Bowel: Stomach, visualized large and small bowel unremarkable.  Vascular/Lymphatic: Scattered aortic calcifications. No evidence of aneurysm or adenopathy.  Other: No free fluid or free air.  Musculoskeletal: No acute bony abnormality.  IMPRESSION: Cholelithiasis.  Subtle nodular contours of the liver suggests the possibility of early cirrhosis. No focal hepatic abnormality.  Aortic atherosclerosis.  No acute findings.  ASSESSMENT & PLAN:  A 68 y.o.  female who I was asked to consult upon for thrombocytopenia.  When evaluating her entire clinical picture, I do believe her mild thrombocytopenia is due to her baseline liver disease.  In addition to having early signs of cirrhosis on her recent CT scan, she has abnormal liver enzymes, as well as prolonged coags from a few years ago.  All of these findings are consistent with some degree of liver dysfunction, which can lead to thrombocytopenia.  Of note, she is not on any medicines which are known to cause thrombocytopenia via bone marrow suppression.  I do not believe she has any underlying bone marrow disorders, particularly as her white cells and hemoglobin are normal today.  She recently had vitamin B12 and folate levels checked at her primary care office, which came back normal at 615 and 14.1, respectively.  As mentioned previously, her thrombocytopenia, although new to her, has been present for at least the past 2 years.  As it is stable at 84 today, her thrombocytopenia will continue to be followed conservatively.  I will see her back in 4 months for repeat clinical assessment.  The patient understands all the plans discussed today and is in agreement with them.  I do appreciate Paulina Fusi, MD for his new consult.   Zaydyn Havey Kirby Funk, MD

## 2023-06-06 ENCOUNTER — Inpatient Hospital Stay: Payer: Medicare HMO | Attending: Oncology

## 2023-06-06 ENCOUNTER — Inpatient Hospital Stay (HOSPITAL_BASED_OUTPATIENT_CLINIC_OR_DEPARTMENT_OTHER): Payer: Medicare HMO | Admitting: Oncology

## 2023-06-06 ENCOUNTER — Telehealth: Payer: Self-pay | Admitting: Oncology

## 2023-06-06 ENCOUNTER — Other Ambulatory Visit: Payer: Self-pay | Admitting: Oncology

## 2023-06-06 VITALS — BP 178/69 | HR 87 | Temp 97.7°F | Resp 14 | Ht 61.5 in | Wt 222.2 lb

## 2023-06-06 DIAGNOSIS — K7460 Unspecified cirrhosis of liver: Secondary | ICD-10-CM | POA: Insufficient documentation

## 2023-06-06 DIAGNOSIS — D6959 Other secondary thrombocytopenia: Secondary | ICD-10-CM | POA: Insufficient documentation

## 2023-06-06 DIAGNOSIS — D696 Thrombocytopenia, unspecified: Secondary | ICD-10-CM

## 2023-06-06 LAB — CBC WITH DIFFERENTIAL (CANCER CENTER ONLY)
Abs Immature Granulocytes: 0.02 10*3/uL (ref 0.00–0.07)
Basophils Absolute: 0.1 10*3/uL (ref 0.0–0.1)
Basophils Relative: 1 %
Eosinophils Absolute: 0.1 10*3/uL (ref 0.0–0.5)
Eosinophils Relative: 1 %
HCT: 38.3 % (ref 36.0–46.0)
Hemoglobin: 13.6 g/dL (ref 12.0–15.0)
Immature Granulocytes: 0 %
Immature Platelet Fraction: 3 % (ref 1.2–8.6)
Lymphocytes Relative: 23 %
Lymphs Abs: 1.3 10*3/uL (ref 0.7–4.0)
MCH: 33.7 pg (ref 26.0–34.0)
MCHC: 35.5 g/dL (ref 30.0–36.0)
MCV: 95 fL (ref 80.0–100.0)
Monocytes Absolute: 0.7 10*3/uL (ref 0.1–1.0)
Monocytes Relative: 13 %
Neutro Abs: 3.4 10*3/uL (ref 1.7–7.7)
Neutrophils Relative %: 62 %
Platelet Count: 67 10*3/uL — ABNORMAL LOW (ref 150–400)
RBC: 4.03 MIL/uL (ref 3.87–5.11)
RDW: 14.7 % (ref 11.5–15.5)
WBC Count: 5.4 10*3/uL (ref 4.0–10.5)
nRBC: 0 % (ref 0.0–0.2)
nRBC: 0 /100{WBCs}

## 2023-06-06 NOTE — Progress Notes (Signed)
[facility]  [address] [place],  [place]  [address] [phone]  Clinic Day:  06/06/2023  Referring physician: Paulina Fusi, MD   HISTORY OF PRESENT ILLNESS:  The patient is a 68 y.o. female who I recently began seeing for thrombocytopenia.  Previous studies have shown that her low platelets are likely due to her underlying cirrhosis.  Since her last visit, she was seen by liver specialist to evaluate her cirrhosis.  Although present, the patient was told that her cirrhosis at this point in time is not particularly severe.  She is scheduled to undergo both an EGD and colonoscopy to ensure sequela I of cirrhosis, such as varices, have not developed.  Despite her thrombocytopenia, she denies having any subcutaneous bleeding/bruising issues.   PHYSICAL EXAM:  Blood pressure (!) 178/69, pulse 87, temperature 97.7 F (36.5 C), temperature source Oral, resp. rate 14, height 5' 1.5" (1.562 m), weight 222 lb 3.2 oz (100.8 kg), SpO2 99%. Wt Readings from Last 3 Encounters:  06/06/23 222 lb 3.2 oz (100.8 kg)  01/21/23 227 lb 6.4 oz (103.1 kg)  09/26/20 220 lb 0.3 oz (99.8 kg)   Body mass index is 41.3 kg/m. Performance status (ECOG): 1 - Symptomatic but completely ambulatory Physical Exam Constitutional:      Appearance: Normal appearance. She is obese. She is not ill-appearing.  HENT:     Mouth/Throat:     Mouth: Mucous membranes are moist.     Pharynx: Oropharynx is clear. No oropharyngeal exudate or posterior oropharyngeal erythema.  Cardiovascular:     Rate and Rhythm: Normal rate and regular rhythm.     Heart sounds: No murmur heard.    No friction rub. No gallop.  Pulmonary:     Effort: Pulmonary effort is normal. No respiratory distress.     Breath sounds: Normal breath sounds. No wheezing, rhonchi or rales.  Abdominal:     General: Bowel sounds are normal. There is no distension.     Palpations: Abdomen is soft. There is no mass.      Tenderness: There is no abdominal tenderness.  Musculoskeletal:        General: No swelling.     Right lower leg: No edema.     Left lower leg: No edema.  Lymphadenopathy:     Cervical: No cervical adenopathy.     Upper Body:     Right upper body: No supraclavicular or axillary adenopathy.     Left upper body: No supraclavicular or axillary adenopathy.     Lower Body: No right inguinal adenopathy. No left inguinal adenopathy.  Skin:    General: Skin is warm.     Coloration: Skin is not jaundiced.     Findings: No lesion or rash.  Neurological:     General: No focal deficit present.     Mental Status: She is alert and oriented to person, place, and time. Mental status is at baseline.  Psychiatric:        Mood and Affect: Mood normal.        Behavior: Behavior normal.        Thought Content: Thought content normal.    LABS:      Latest Ref Rng & Units 06/06/2023    9:51 AM 01/21/2023   12:00 AM 10/03/2020    5:26 PM  CBC  WBC 4.0 - 10.5 K/uL 5.4  8.2     4.3   Hemoglobin 12.0 - 15.0 g/dL 86.5  78.4  9.7   Hematocrit 36.0 - 46.0 % 38.3  40     28.2   Platelets 150 - 400 K/uL 67  84     PLATELET CLUMPS NOTED ON SMEAR, UNABLE TO ESTIMATE      This result is from an external source.      Latest Ref Rng & Units 01/21/2023   10:38 AM 10/03/2020    5:26 PM 10/03/2020    2:14 AM  CMP  Glucose 70 - 99 mg/dL 829  562    BUN 8 - 23 mg/dL 12  10    Creatinine 1.30 - 1.00 mg/dL 8.65  7.84  6.96   Sodium 135 - 145 mmol/L 138  135    Potassium 3.5 - 5.1 mmol/L 3.9  4.4    Chloride 98 - 111 mmol/L 108  107    CO2 22 - 32 mmol/L 24  21    Calcium 8.9 - 10.3 mg/dL 29.5  28.4    Total Protein 6.5 - 8.1 g/dL 6.1  5.2    Total Bilirubin 0.3 - 1.2 mg/dL 3.7  2.1    Alkaline Phos 38 - 126 U/L 84  129    AST 15 - 41 U/L 54  57    ALT 0 - 44 U/L 55  33     ASSESSMENT & PLAN:  Assessment/Plan:  A 68 y.o. 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  Her platelet count of  67 is slightly lower than what it has been over the past few years.  However, I reminded the patient that she still has more than enough platelets to do the clotting her body needs.  Usually, with thrombocytopenia secondary to cirrhosis, it is very rare for a patient to need a plateletpheresis.  As her thrombocytopenia remains relatively mild, she will continue to be followed conservatively.  I will see her back in 6 months for repeat clinical assessment.  The patient understands all the plans discussed today and is in agreement with them.    Carlee Vonderhaar Kirby Funk, MD

## 2023-06-06 NOTE — Telephone Encounter (Signed)
06/06/23 Next appt scheduled and confirmed with patient

## 2023-08-14 DIAGNOSIS — L814 Other melanin hyperpigmentation: Secondary | ICD-10-CM | POA: Diagnosis not present

## 2023-08-14 DIAGNOSIS — L209 Atopic dermatitis, unspecified: Secondary | ICD-10-CM | POA: Diagnosis not present

## 2023-08-14 DIAGNOSIS — D2239 Melanocytic nevi of other parts of face: Secondary | ICD-10-CM | POA: Diagnosis not present

## 2023-08-14 DIAGNOSIS — L719 Rosacea, unspecified: Secondary | ICD-10-CM | POA: Diagnosis not present

## 2023-08-14 DIAGNOSIS — L570 Actinic keratosis: Secondary | ICD-10-CM | POA: Diagnosis not present

## 2023-09-08 DIAGNOSIS — E785 Hyperlipidemia, unspecified: Secondary | ICD-10-CM | POA: Diagnosis not present

## 2023-09-08 DIAGNOSIS — R7301 Impaired fasting glucose: Secondary | ICD-10-CM | POA: Diagnosis not present

## 2023-09-08 DIAGNOSIS — I10 Essential (primary) hypertension: Secondary | ICD-10-CM | POA: Diagnosis not present

## 2023-09-08 DIAGNOSIS — K760 Fatty (change of) liver, not elsewhere classified: Secondary | ICD-10-CM | POA: Diagnosis not present

## 2023-09-08 DIAGNOSIS — F419 Anxiety disorder, unspecified: Secondary | ICD-10-CM | POA: Diagnosis not present

## 2023-09-08 DIAGNOSIS — K219 Gastro-esophageal reflux disease without esophagitis: Secondary | ICD-10-CM | POA: Diagnosis not present

## 2023-09-08 DIAGNOSIS — D696 Thrombocytopenia, unspecified: Secondary | ICD-10-CM | POA: Diagnosis not present

## 2023-09-08 DIAGNOSIS — H8113 Benign paroxysmal vertigo, bilateral: Secondary | ICD-10-CM | POA: Diagnosis not present

## 2023-09-18 DIAGNOSIS — L209 Atopic dermatitis, unspecified: Secondary | ICD-10-CM | POA: Diagnosis not present

## 2023-09-18 DIAGNOSIS — L719 Rosacea, unspecified: Secondary | ICD-10-CM | POA: Diagnosis not present

## 2023-09-30 DIAGNOSIS — K219 Gastro-esophageal reflux disease without esophagitis: Secondary | ICD-10-CM | POA: Diagnosis not present

## 2023-09-30 DIAGNOSIS — K7460 Unspecified cirrhosis of liver: Secondary | ICD-10-CM | POA: Diagnosis not present

## 2023-09-30 DIAGNOSIS — D696 Thrombocytopenia, unspecified: Secondary | ICD-10-CM | POA: Diagnosis not present

## 2023-10-22 ENCOUNTER — Other Ambulatory Visit: Payer: Self-pay | Admitting: Nurse Practitioner

## 2023-10-22 DIAGNOSIS — K7581 Nonalcoholic steatohepatitis (NASH): Secondary | ICD-10-CM | POA: Diagnosis not present

## 2023-10-22 DIAGNOSIS — K7469 Other cirrhosis of liver: Secondary | ICD-10-CM | POA: Diagnosis not present

## 2023-10-30 ENCOUNTER — Ambulatory Visit (HOSPITAL_BASED_OUTPATIENT_CLINIC_OR_DEPARTMENT_OTHER)
Admission: RE | Admit: 2023-10-30 | Discharge: 2023-10-30 | Disposition: A | Discharge status: Home / Self Care | Source: Ambulatory Visit | Attending: Nurse Practitioner | Admitting: Nurse Practitioner

## 2023-10-30 ENCOUNTER — Other Ambulatory Visit (HOSPITAL_BASED_OUTPATIENT_CLINIC_OR_DEPARTMENT_OTHER): Payer: Self-pay | Admitting: Nurse Practitioner

## 2023-10-30 DIAGNOSIS — K7469 Other cirrhosis of liver: Secondary | ICD-10-CM

## 2023-10-30 DIAGNOSIS — K7460 Unspecified cirrhosis of liver: Secondary | ICD-10-CM | POA: Diagnosis not present

## 2023-10-30 DIAGNOSIS — K7581 Nonalcoholic steatohepatitis (NASH): Secondary | ICD-10-CM

## 2023-10-30 DIAGNOSIS — K8020 Calculus of gallbladder without cholecystitis without obstruction: Secondary | ICD-10-CM | POA: Diagnosis not present

## 2023-11-05 DIAGNOSIS — D696 Thrombocytopenia, unspecified: Secondary | ICD-10-CM | POA: Diagnosis not present

## 2023-11-11 DIAGNOSIS — H01132 Eczematous dermatitis of right lower eyelid: Secondary | ICD-10-CM | POA: Diagnosis not present

## 2023-11-11 DIAGNOSIS — H04123 Dry eye syndrome of bilateral lacrimal glands: Secondary | ICD-10-CM | POA: Diagnosis not present

## 2023-11-11 DIAGNOSIS — H40013 Open angle with borderline findings, low risk, bilateral: Secondary | ICD-10-CM | POA: Diagnosis not present

## 2023-11-11 DIAGNOSIS — H524 Presbyopia: Secondary | ICD-10-CM | POA: Diagnosis not present

## 2023-11-11 DIAGNOSIS — Z961 Presence of intraocular lens: Secondary | ICD-10-CM | POA: Diagnosis not present

## 2023-11-11 DIAGNOSIS — H01135 Eczematous dermatitis of left lower eyelid: Secondary | ICD-10-CM | POA: Diagnosis not present

## 2023-11-12 DIAGNOSIS — Z79899 Other long term (current) drug therapy: Secondary | ICD-10-CM | POA: Diagnosis not present

## 2023-11-12 DIAGNOSIS — K449 Diaphragmatic hernia without obstruction or gangrene: Secondary | ICD-10-CM | POA: Diagnosis not present

## 2023-11-12 DIAGNOSIS — Z888 Allergy status to other drugs, medicaments and biological substances status: Secondary | ICD-10-CM | POA: Diagnosis not present

## 2023-11-12 DIAGNOSIS — R1013 Epigastric pain: Secondary | ICD-10-CM | POA: Diagnosis not present

## 2023-11-12 DIAGNOSIS — I10 Essential (primary) hypertension: Secondary | ICD-10-CM | POA: Diagnosis not present

## 2023-11-12 DIAGNOSIS — K219 Gastro-esophageal reflux disease without esophagitis: Secondary | ICD-10-CM | POA: Diagnosis not present

## 2023-11-12 DIAGNOSIS — K319 Disease of stomach and duodenum, unspecified: Secondary | ICD-10-CM | POA: Diagnosis not present

## 2023-11-12 DIAGNOSIS — Z860101 Personal history of adenomatous and serrated colon polyps: Secondary | ICD-10-CM | POA: Diagnosis not present

## 2023-11-12 DIAGNOSIS — K7460 Unspecified cirrhosis of liver: Secondary | ICD-10-CM | POA: Diagnosis not present

## 2023-11-12 DIAGNOSIS — K317 Polyp of stomach and duodenum: Secondary | ICD-10-CM | POA: Diagnosis not present

## 2023-11-12 DIAGNOSIS — Z860100 Personal history of colon polyps, unspecified: Secondary | ICD-10-CM | POA: Diagnosis not present

## 2023-11-18 DIAGNOSIS — D485 Neoplasm of uncertain behavior of skin: Secondary | ICD-10-CM | POA: Diagnosis not present

## 2023-11-18 DIAGNOSIS — L308 Other specified dermatitis: Secondary | ICD-10-CM | POA: Diagnosis not present

## 2023-11-18 DIAGNOSIS — L209 Atopic dermatitis, unspecified: Secondary | ICD-10-CM | POA: Diagnosis not present

## 2023-11-18 DIAGNOSIS — L299 Pruritus, unspecified: Secondary | ICD-10-CM | POA: Diagnosis not present

## 2023-11-18 DIAGNOSIS — L300 Nummular dermatitis: Secondary | ICD-10-CM | POA: Diagnosis not present

## 2023-12-03 DIAGNOSIS — L209 Atopic dermatitis, unspecified: Secondary | ICD-10-CM | POA: Diagnosis not present

## 2023-12-04 ENCOUNTER — Ambulatory Visit: Payer: Medicare HMO | Admitting: Oncology

## 2023-12-04 ENCOUNTER — Other Ambulatory Visit: Payer: Medicare HMO

## 2023-12-17 NOTE — Progress Notes (Unsigned)
 [facility] [address] [place],  [place]  [address] [phone]  Clinic Day:  12/18/2023  Referring physician: Keren Vicenta BRAVO, MD   HISTORY OF PRESENT ILLNESS:  The patient is a 68 y.o. female with thrombocytopenia.  Previous studies have shown that her low platelets are likely due to her underlying cirrhosis.  She comes in today for routine follow-up.  Since her last visit, the patient has been doing fairly well.  She denies having any significant subcutaneous bleeding/bruising issues which concern her for severe thrombocytopenia being present.  Of note, she is being followed by a liver specialist for her baseline cirrhosis.  PHYSICAL EXAM:  Blood pressure (!) 165/61, pulse 74, temperature 97.8 F (36.6 C), temperature source Oral, resp. rate 16, height 5' 1.5 (1.562 m), weight 229 lb 8 oz (104.1 kg), SpO2 100%. Wt Readings from Last 3 Encounters:  12/18/23 229 lb 8 oz (104.1 kg)  06/06/23 222 lb 3.2 oz (100.8 kg)  01/21/23 227 lb 6.4 oz (103.1 kg)   Body mass index is 42.66 kg/m. Performance status (ECOG): 1 - Symptomatic but completely ambulatory Physical Exam Constitutional:      Appearance: Normal appearance. She is obese. She is not ill-appearing.  HENT:     Mouth/Throat:     Mouth: Mucous membranes are moist.     Pharynx: Oropharynx is clear. No oropharyngeal exudate or posterior oropharyngeal erythema.  Cardiovascular:     Rate and Rhythm: Normal rate and regular rhythm.     Heart sounds: No murmur heard.    No friction rub. No gallop.  Pulmonary:     Effort: Pulmonary effort is normal. No respiratory distress.     Breath sounds: Normal breath sounds. No wheezing, rhonchi or rales.  Abdominal:     General: Bowel sounds are normal. There is no distension.     Palpations: Abdomen is soft. There is no mass.     Tenderness: There is no abdominal tenderness.  Musculoskeletal:        General: No swelling.     Right lower leg: No  edema.     Left lower leg: No edema.  Lymphadenopathy:     Cervical: No cervical adenopathy.     Upper Body:     Right upper body: No supraclavicular or axillary adenopathy.     Left upper body: No supraclavicular or axillary adenopathy.     Lower Body: No right inguinal adenopathy. No left inguinal adenopathy.  Skin:    General: Skin is warm.     Coloration: Skin is not jaundiced.     Findings: No lesion or rash.  Neurological:     General: No focal deficit present.     Mental Status: She is alert and oriented to person, place, and time. Mental status is at baseline.  Psychiatric:        Mood and Affect: Mood normal.        Behavior: Behavior normal.        Thought Content: Thought content normal.    LABS:      Latest Ref Rng & Units 12/18/2023    9:48 AM 06/06/2023    9:51 AM 01/21/2023   12:00 AM  CBC  WBC 4.0 - 10.5 K/uL 5.4  5.4  8.2      Hemoglobin 12.0 - 15.0 g/dL 86.7  86.3  85.8      Hematocrit 36.0 - 46.0 % 38.8  38.3  40      Platelets  150 - 400 K/uL 58  67  84         This result is from an external source.      Latest Ref Rng & Units 12/18/2023    9:48 AM 01/21/2023   10:38 AM 10/03/2020    5:26 PM  CMP  Glucose 70 - 99 mg/dL 823  854  846   BUN 8 - 23 mg/dL 12  12  10    Creatinine 0.44 - 1.00 mg/dL 9.29  9.46  9.45   Sodium 135 - 145 mmol/L 140  138  135   Potassium 3.5 - 5.1 mmol/L 4.4  3.9  4.4   Chloride 98 - 111 mmol/L 109  108  107   CO2 22 - 32 mmol/L 22  24  21    Calcium 8.9 - 10.3 mg/dL 88.9  89.4  89.8   Total Protein 6.5 - 8.1 g/dL 5.9  6.1  5.2   Total Bilirubin 0.0 - 1.2 mg/dL 2.3  3.7  2.1   Alkaline Phos 38 - 126 U/L 150  84  129   AST 15 - 41 U/L 59  54  57   ALT 0 - 44 U/L 60  55  33    ASSESSMENT & PLAN:  Assessment/Plan:  A 68 y.o. 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  Her platelet count of 58 is lower than what it has been over the past few years.  However, I reminded the patient that she still has more than  enough platelets to do the clotting her body needs.  Usually, with thrombocytopenia secondary to cirrhosis, it is very rare for a patient to need a plateletpheresis.  Her thrombocytopenia will continue to be followed conservatively.  She knows to continue to follow with her liver specialist for her baseline cirrhosis.  Otherwise, I will see her back in 6 months for repeat clinical assessment.  The patient understands all the plans discussed today and is in agreement with them.    Kruz Chiu DELENA Kerns, MD

## 2023-12-18 ENCOUNTER — Other Ambulatory Visit: Payer: Self-pay | Admitting: Oncology

## 2023-12-18 ENCOUNTER — Telehealth: Payer: Self-pay | Admitting: Oncology

## 2023-12-18 ENCOUNTER — Inpatient Hospital Stay: Admitting: Oncology

## 2023-12-18 ENCOUNTER — Inpatient Hospital Stay: Attending: Oncology

## 2023-12-18 VITALS — BP 165/61 | HR 74 | Temp 97.8°F | Resp 16 | Ht 61.5 in | Wt 229.5 lb

## 2023-12-18 DIAGNOSIS — K7460 Unspecified cirrhosis of liver: Secondary | ICD-10-CM | POA: Diagnosis not present

## 2023-12-18 DIAGNOSIS — D696 Thrombocytopenia, unspecified: Secondary | ICD-10-CM | POA: Diagnosis not present

## 2023-12-18 LAB — CMP (CANCER CENTER ONLY)
ALT: 60 U/L — ABNORMAL HIGH (ref 0–44)
AST: 59 U/L — ABNORMAL HIGH (ref 15–41)
Albumin: 3.4 g/dL — ABNORMAL LOW (ref 3.5–5.0)
Alkaline Phosphatase: 150 U/L — ABNORMAL HIGH (ref 38–126)
Anion gap: 9 (ref 5–15)
BUN: 12 mg/dL (ref 8–23)
CO2: 22 mmol/L (ref 22–32)
Calcium: 11 mg/dL — ABNORMAL HIGH (ref 8.9–10.3)
Chloride: 109 mmol/L (ref 98–111)
Creatinine: 0.7 mg/dL (ref 0.44–1.00)
GFR, Estimated: >60 mL/min (ref >60)
Glucose, Bld: 176 mg/dL — ABNORMAL HIGH (ref 70–99)
Potassium: 4.4 mmol/L (ref 3.5–5.1)
Sodium: 140 mmol/L (ref 135–145)
Total Bilirubin: 2.3 mg/dL — ABNORMAL HIGH (ref 0.0–1.2)
Total Protein: 5.9 g/dL — ABNORMAL LOW (ref 6.5–8.1)

## 2023-12-18 LAB — CBC WITH DIFFERENTIAL (CANCER CENTER ONLY)
Abs Immature Granulocytes: 0.02 K/uL (ref 0.00–0.07)
Basophils Absolute: 0 K/uL (ref 0.0–0.1)
Basophils Relative: 1 %
Eosinophils Absolute: 0.1 K/uL (ref 0.0–0.5)
Eosinophils Relative: 1 %
HCT: 38.8 % (ref 36.0–46.0)
Hemoglobin: 13.2 g/dL (ref 12.0–15.0)
Immature Granulocytes: 0 %
Lymphocytes Relative: 22 %
Lymphs Abs: 1.2 K/uL (ref 0.7–4.0)
MCH: 32.9 pg (ref 26.0–34.0)
MCHC: 34 g/dL (ref 30.0–36.0)
MCV: 96.8 fL (ref 80.0–100.0)
Monocytes Absolute: 0.5 K/uL (ref 0.1–1.0)
Monocytes Relative: 10 %
Neutro Abs: 3.6 K/uL (ref 1.7–7.7)
Neutrophils Relative %: 66 %
Platelet Count: 58 K/uL — ABNORMAL LOW (ref 150–400)
RBC: 4.01 MIL/uL (ref 3.87–5.11)
RDW: 14.6 % (ref 11.5–15.5)
WBC Count: 5.4 K/uL (ref 4.0–10.5)
nRBC: 0 % (ref 0.0–0.2)

## 2023-12-18 NOTE — Telephone Encounter (Signed)
 Patient has been scheduled for follow-up visit per 12/18/23 LOS.  Pt given an appt calendar with date and time.

## 2024-02-10 DIAGNOSIS — M549 Dorsalgia, unspecified: Secondary | ICD-10-CM | POA: Diagnosis not present

## 2024-02-10 DIAGNOSIS — M545 Low back pain, unspecified: Secondary | ICD-10-CM | POA: Diagnosis not present

## 2024-02-16 DIAGNOSIS — M5137 Other intervertebral disc degeneration, lumbosacral region with discogenic back pain only: Secondary | ICD-10-CM | POA: Diagnosis not present

## 2024-02-16 DIAGNOSIS — M9905 Segmental and somatic dysfunction of pelvic region: Secondary | ICD-10-CM | POA: Diagnosis not present

## 2024-02-16 DIAGNOSIS — M9904 Segmental and somatic dysfunction of sacral region: Secondary | ICD-10-CM | POA: Diagnosis not present

## 2024-02-16 DIAGNOSIS — M5135 Other intervertebral disc degeneration, thoracolumbar region: Secondary | ICD-10-CM | POA: Diagnosis not present

## 2024-02-16 DIAGNOSIS — M5136 Other intervertebral disc degeneration, lumbar region with discogenic back pain only: Secondary | ICD-10-CM | POA: Diagnosis not present

## 2024-02-16 DIAGNOSIS — M9903 Segmental and somatic dysfunction of lumbar region: Secondary | ICD-10-CM | POA: Diagnosis not present

## 2024-02-17 DIAGNOSIS — M9903 Segmental and somatic dysfunction of lumbar region: Secondary | ICD-10-CM | POA: Diagnosis not present

## 2024-02-17 DIAGNOSIS — M9904 Segmental and somatic dysfunction of sacral region: Secondary | ICD-10-CM | POA: Diagnosis not present

## 2024-02-17 DIAGNOSIS — M5137 Other intervertebral disc degeneration, lumbosacral region with discogenic back pain only: Secondary | ICD-10-CM | POA: Diagnosis not present

## 2024-02-17 DIAGNOSIS — M5136 Other intervertebral disc degeneration, lumbar region with discogenic back pain only: Secondary | ICD-10-CM | POA: Diagnosis not present

## 2024-02-17 DIAGNOSIS — M9905 Segmental and somatic dysfunction of pelvic region: Secondary | ICD-10-CM | POA: Diagnosis not present

## 2024-02-17 DIAGNOSIS — M5135 Other intervertebral disc degeneration, thoracolumbar region: Secondary | ICD-10-CM | POA: Diagnosis not present

## 2024-02-19 DIAGNOSIS — M9904 Segmental and somatic dysfunction of sacral region: Secondary | ICD-10-CM | POA: Diagnosis not present

## 2024-02-19 DIAGNOSIS — M5136 Other intervertebral disc degeneration, lumbar region with discogenic back pain only: Secondary | ICD-10-CM | POA: Diagnosis not present

## 2024-02-19 DIAGNOSIS — M9903 Segmental and somatic dysfunction of lumbar region: Secondary | ICD-10-CM | POA: Diagnosis not present

## 2024-02-19 DIAGNOSIS — M9905 Segmental and somatic dysfunction of pelvic region: Secondary | ICD-10-CM | POA: Diagnosis not present

## 2024-02-19 DIAGNOSIS — M5135 Other intervertebral disc degeneration, thoracolumbar region: Secondary | ICD-10-CM | POA: Diagnosis not present

## 2024-02-19 DIAGNOSIS — M5137 Other intervertebral disc degeneration, lumbosacral region with discogenic back pain only: Secondary | ICD-10-CM | POA: Diagnosis not present

## 2024-02-23 DIAGNOSIS — M5136 Other intervertebral disc degeneration, lumbar region with discogenic back pain only: Secondary | ICD-10-CM | POA: Diagnosis not present

## 2024-02-23 DIAGNOSIS — M9904 Segmental and somatic dysfunction of sacral region: Secondary | ICD-10-CM | POA: Diagnosis not present

## 2024-02-23 DIAGNOSIS — M9905 Segmental and somatic dysfunction of pelvic region: Secondary | ICD-10-CM | POA: Diagnosis not present

## 2024-02-23 DIAGNOSIS — M9903 Segmental and somatic dysfunction of lumbar region: Secondary | ICD-10-CM | POA: Diagnosis not present

## 2024-02-23 DIAGNOSIS — M5135 Other intervertebral disc degeneration, thoracolumbar region: Secondary | ICD-10-CM | POA: Diagnosis not present

## 2024-02-23 DIAGNOSIS — M5137 Other intervertebral disc degeneration, lumbosacral region with discogenic back pain only: Secondary | ICD-10-CM | POA: Diagnosis not present

## 2024-02-24 DIAGNOSIS — M9904 Segmental and somatic dysfunction of sacral region: Secondary | ICD-10-CM | POA: Diagnosis not present

## 2024-02-24 DIAGNOSIS — M5137 Other intervertebral disc degeneration, lumbosacral region with discogenic back pain only: Secondary | ICD-10-CM | POA: Diagnosis not present

## 2024-02-24 DIAGNOSIS — M5136 Other intervertebral disc degeneration, lumbar region with discogenic back pain only: Secondary | ICD-10-CM | POA: Diagnosis not present

## 2024-02-24 DIAGNOSIS — M9903 Segmental and somatic dysfunction of lumbar region: Secondary | ICD-10-CM | POA: Diagnosis not present

## 2024-02-24 DIAGNOSIS — M5135 Other intervertebral disc degeneration, thoracolumbar region: Secondary | ICD-10-CM | POA: Diagnosis not present

## 2024-02-24 DIAGNOSIS — M9905 Segmental and somatic dysfunction of pelvic region: Secondary | ICD-10-CM | POA: Diagnosis not present

## 2024-02-26 DIAGNOSIS — M9905 Segmental and somatic dysfunction of pelvic region: Secondary | ICD-10-CM | POA: Diagnosis not present

## 2024-02-26 DIAGNOSIS — M5136 Other intervertebral disc degeneration, lumbar region with discogenic back pain only: Secondary | ICD-10-CM | POA: Diagnosis not present

## 2024-02-26 DIAGNOSIS — M9904 Segmental and somatic dysfunction of sacral region: Secondary | ICD-10-CM | POA: Diagnosis not present

## 2024-02-26 DIAGNOSIS — M5135 Other intervertebral disc degeneration, thoracolumbar region: Secondary | ICD-10-CM | POA: Diagnosis not present

## 2024-02-26 DIAGNOSIS — M5137 Other intervertebral disc degeneration, lumbosacral region with discogenic back pain only: Secondary | ICD-10-CM | POA: Diagnosis not present

## 2024-02-26 DIAGNOSIS — M9903 Segmental and somatic dysfunction of lumbar region: Secondary | ICD-10-CM | POA: Diagnosis not present

## 2024-03-01 DIAGNOSIS — M5135 Other intervertebral disc degeneration, thoracolumbar region: Secondary | ICD-10-CM | POA: Diagnosis not present

## 2024-03-01 DIAGNOSIS — M9904 Segmental and somatic dysfunction of sacral region: Secondary | ICD-10-CM | POA: Diagnosis not present

## 2024-03-01 DIAGNOSIS — M9903 Segmental and somatic dysfunction of lumbar region: Secondary | ICD-10-CM | POA: Diagnosis not present

## 2024-03-01 DIAGNOSIS — M5137 Other intervertebral disc degeneration, lumbosacral region with discogenic back pain only: Secondary | ICD-10-CM | POA: Diagnosis not present

## 2024-03-01 DIAGNOSIS — M5136 Other intervertebral disc degeneration, lumbar region with discogenic back pain only: Secondary | ICD-10-CM | POA: Diagnosis not present

## 2024-03-01 DIAGNOSIS — M9905 Segmental and somatic dysfunction of pelvic region: Secondary | ICD-10-CM | POA: Diagnosis not present

## 2024-03-04 DIAGNOSIS — M4726 Other spondylosis with radiculopathy, lumbar region: Secondary | ICD-10-CM | POA: Diagnosis not present

## 2024-03-04 DIAGNOSIS — M51379 Other intervertebral disc degeneration, lumbosacral region without mention of lumbar back pain or lower extremity pain: Secondary | ICD-10-CM | POA: Diagnosis not present

## 2024-03-12 DIAGNOSIS — M5116 Intervertebral disc disorders with radiculopathy, lumbar region: Secondary | ICD-10-CM | POA: Diagnosis not present

## 2024-03-12 DIAGNOSIS — Z1330 Encounter for screening examination for mental health and behavioral disorders, unspecified: Secondary | ICD-10-CM | POA: Diagnosis not present

## 2024-03-12 DIAGNOSIS — M549 Dorsalgia, unspecified: Secondary | ICD-10-CM | POA: Diagnosis not present

## 2024-03-18 DIAGNOSIS — M4726 Other spondylosis with radiculopathy, lumbar region: Secondary | ICD-10-CM | POA: Diagnosis not present

## 2024-03-19 DIAGNOSIS — M48061 Spinal stenosis, lumbar region without neurogenic claudication: Secondary | ICD-10-CM | POA: Diagnosis not present

## 2024-03-19 DIAGNOSIS — M4726 Other spondylosis with radiculopathy, lumbar region: Secondary | ICD-10-CM | POA: Diagnosis not present

## 2024-03-19 DIAGNOSIS — M5116 Intervertebral disc disorders with radiculopathy, lumbar region: Secondary | ICD-10-CM | POA: Diagnosis not present

## 2024-03-30 DIAGNOSIS — D696 Thrombocytopenia, unspecified: Secondary | ICD-10-CM | POA: Diagnosis not present

## 2024-03-30 DIAGNOSIS — K760 Fatty (change of) liver, not elsewhere classified: Secondary | ICD-10-CM | POA: Diagnosis not present

## 2024-03-30 DIAGNOSIS — K219 Gastro-esophageal reflux disease without esophagitis: Secondary | ICD-10-CM | POA: Diagnosis not present

## 2024-03-30 DIAGNOSIS — R7301 Impaired fasting glucose: Secondary | ICD-10-CM | POA: Diagnosis not present

## 2024-03-30 DIAGNOSIS — E66813 Obesity, class 3: Secondary | ICD-10-CM | POA: Diagnosis not present

## 2024-03-30 DIAGNOSIS — E785 Hyperlipidemia, unspecified: Secondary | ICD-10-CM | POA: Diagnosis not present

## 2024-03-30 DIAGNOSIS — Z6841 Body Mass Index (BMI) 40.0 and over, adult: Secondary | ICD-10-CM | POA: Diagnosis not present

## 2024-03-30 DIAGNOSIS — I10 Essential (primary) hypertension: Secondary | ICD-10-CM | POA: Diagnosis not present

## 2024-04-09 DIAGNOSIS — N390 Urinary tract infection, site not specified: Secondary | ICD-10-CM | POA: Diagnosis not present

## 2024-04-09 DIAGNOSIS — M5116 Intervertebral disc disorders with radiculopathy, lumbar region: Secondary | ICD-10-CM | POA: Diagnosis not present

## 2024-04-09 DIAGNOSIS — M5416 Radiculopathy, lumbar region: Secondary | ICD-10-CM | POA: Diagnosis not present

## 2024-04-09 DIAGNOSIS — N3001 Acute cystitis with hematuria: Secondary | ICD-10-CM | POA: Diagnosis not present

## 2024-04-09 DIAGNOSIS — M48062 Spinal stenosis, lumbar region with neurogenic claudication: Secondary | ICD-10-CM | POA: Diagnosis not present

## 2024-04-11 DIAGNOSIS — R29810 Facial weakness: Secondary | ICD-10-CM | POA: Diagnosis not present

## 2024-04-11 DIAGNOSIS — S79912A Unspecified injury of left hip, initial encounter: Secondary | ICD-10-CM | POA: Diagnosis not present

## 2024-04-11 DIAGNOSIS — R531 Weakness: Secondary | ICD-10-CM | POA: Diagnosis not present

## 2024-04-11 DIAGNOSIS — R739 Hyperglycemia, unspecified: Secondary | ICD-10-CM | POA: Diagnosis not present

## 2024-04-11 DIAGNOSIS — W19XXXA Unspecified fall, initial encounter: Secondary | ICD-10-CM | POA: Diagnosis not present

## 2024-04-11 DIAGNOSIS — R319 Hematuria, unspecified: Secondary | ICD-10-CM | POA: Diagnosis not present

## 2024-04-11 DIAGNOSIS — R4781 Slurred speech: Secondary | ICD-10-CM | POA: Diagnosis not present

## 2024-04-11 DIAGNOSIS — R000 Tachycardia, unspecified: Secondary | ICD-10-CM | POA: Diagnosis not present

## 2024-04-12 ENCOUNTER — Telehealth: Payer: Self-pay

## 2024-04-12 DIAGNOSIS — D6959 Other secondary thrombocytopenia: Secondary | ICD-10-CM | POA: Diagnosis not present

## 2024-04-12 DIAGNOSIS — N390 Urinary tract infection, site not specified: Secondary | ICD-10-CM | POA: Diagnosis not present

## 2024-04-12 DIAGNOSIS — R6510 Systemic inflammatory response syndrome (SIRS) of non-infectious origin without acute organ dysfunction: Secondary | ICD-10-CM | POA: Diagnosis not present

## 2024-04-12 DIAGNOSIS — E860 Dehydration: Secondary | ICD-10-CM | POA: Diagnosis not present

## 2024-04-12 DIAGNOSIS — R7401 Elevation of levels of liver transaminase levels: Secondary | ICD-10-CM | POA: Diagnosis not present

## 2024-04-12 DIAGNOSIS — A419 Sepsis, unspecified organism: Secondary | ICD-10-CM | POA: Diagnosis not present

## 2024-04-12 DIAGNOSIS — I63511 Cerebral infarction due to unspecified occlusion or stenosis of right middle cerebral artery: Secondary | ICD-10-CM | POA: Diagnosis not present

## 2024-04-12 DIAGNOSIS — I10 Essential (primary) hypertension: Secondary | ICD-10-CM | POA: Diagnosis not present

## 2024-04-12 DIAGNOSIS — Z6841 Body Mass Index (BMI) 40.0 and over, adult: Secondary | ICD-10-CM | POA: Diagnosis not present

## 2024-04-12 DIAGNOSIS — E872 Acidosis, unspecified: Secondary | ICD-10-CM | POA: Diagnosis not present

## 2024-04-12 DIAGNOSIS — E871 Hypo-osmolality and hyponatremia: Secondary | ICD-10-CM | POA: Diagnosis not present

## 2024-04-12 DIAGNOSIS — I6389 Other cerebral infarction: Secondary | ICD-10-CM | POA: Diagnosis not present

## 2024-04-12 NOTE — Telephone Encounter (Signed)
 Abigail Luna reports that [facility] wanted to bring to Dr Ezzard' attn the results of MRI. Their is significant, abnormal changes @ L3 vertebral bodies, as well degenerative changes.

## 2024-04-16 DIAGNOSIS — K7581 Nonalcoholic steatohepatitis (NASH): Secondary | ICD-10-CM | POA: Diagnosis not present

## 2024-04-26 ENCOUNTER — Other Ambulatory Visit (HOSPITAL_BASED_OUTPATIENT_CLINIC_OR_DEPARTMENT_OTHER): Payer: Self-pay | Admitting: Nurse Practitioner

## 2024-04-26 DIAGNOSIS — K7469 Other cirrhosis of liver: Secondary | ICD-10-CM

## 2024-04-27 ENCOUNTER — Encounter: Payer: Self-pay | Admitting: Neurology

## 2024-04-30 NOTE — Progress Notes (Unsigned)
 "  NEUROLOGY CONSULTATION NOTE  Abigail Luna MRN: [id number] DOB: [birth year]  Referring provider: Vicenta Saras, MD Primary care provider: Vicenta Saras, MD  Reason for consult:  stroke  Assessment/Plan:   ***   Subjective:  Abigail Luna is a 68 year old ***-handed female with HTN, HLD, psoriasis and liver cirrhosis secondary to NASH who presents for stroke.  History supplemented by hospital records.    ***   04/12/2024 Multiple falls.  Had lumbar spine MRI previous week which regvealed severe degenerative disease with central spinal canal narrowing at L3-L4, L4-L5 and L5-S1.  On 12/1 neighbors stopped by and noted slurred speech and left facial weakness CT head did not demonstrate acute intracranial abnormality.  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 -OK'd by her hematologist  PAST MEDICAL HISTORY: Past Medical History:  Diagnosis Date   Anxiety    Arthritis    Back pain    BPPV (benign paroxysmal positional vertigo)    Complication of anesthesia    DDD (degenerative disc disease), lumbar    Eczema    External hemorrhoids    GERD (gastroesophageal reflux disease)    Hyperlipidemia    Hypertension    IDA (iron deficiency anemia)    Liver cirrhosis secondary to NASH (HCC)    Lumbar radiculopathy    Osteoporosis    Palpitations    PONV (postoperative nausea and vomiting)    Pre-diabetes    Psoriasis    Sinus tachycardia    Spinal stenosis    Thrombocytopenia     PAST SURGICAL HISTORY: Past Surgical History:  Procedure Laterality Date   COLONOSCOPY     WITH POLYPECTOMY   REVERSE SHOULDER ARTHROPLASTY Left  09/26/2020   Procedure: REVERSE SHOULDER ARTHROPLASTY;  Surgeon: Sharl Selinda Dover, MD;  Location: [facility] OR;  Service: Orthopedics;  Laterality: Left;  2.5 HRS    MEDICATIONS: Medications Ordered Prior to Encounter[1]  ALLERGIES: Allergies[2]  FAMILY HISTORY: Family History  Problem Relation Age of Onset   Liver cancer Mother    Diabetes Mother    Heart disease Father    Heart failure Father    Heart attack Sister    Diabetes Sister    CVA Brother    Diabetes Brother    Diabetes Brother    Dementia Brother    Breast cancer Paternal Aunt    Breast cancer Paternal Aunt    Breast cancer Niece     Objective:  *** General: No acute distress.  Patient appears well-groomed.   Head:  Normocephalic/atraumatic Eyes:  fundi examined but not visualized Neck: supple, no paraspinal tenderness, full range of motion Heart: regular rate and rhythm Neurological Exam: Mental status: alert and oriented to person, place, and time, speech fluent and not dysarthric, language intact. Cranial nerves: CN I: not tested CN II: pupils equal, round and reactive to light, visual fields intact CN III, IV, VI:  full range of motion, no nystagmus, no ptosis CN V: facial sensation intact. CN VII: upper and lower face symmetric CN VIII: hearing intact CN IX, X: gag intact, uvula midline CN XI: sternocleidomastoid and trapezius muscles intact CN XII: tongue midline Bulk & Tone: normal, no fasciculations. Motor:  muscle strength 5/5 throughout Sensation:  Pinprick and vibratory sensation intact. Deep Tendon Reflexes:  2+ throughout,  toes downgoing.   Finger to nose testing:  Without dysmetria.   Gait:  Normal station and stride.  Romberg negative.    Thank you for allowing me to take part in the care of this patient.  Juliene Dunnings, DO  CC: ***        [1]  Current Outpatient Medications on File Prior to Visit  Medication Sig Dispense Refill   acetaminophen  (TYLENOL ) 500 MG tablet Take  1,000 mg by mouth every 6 (six) hours as needed (pain).     clobetasol cream (TEMOVATE) 0.05 % Apply topically.     clotrimazole-betamethasone (LOTRISONE) cream Apply topically.     ibuprofen (ADVIL) 200 MG tablet Take 400-800 mg by mouth every 8 (eight) hours as needed (pain.).     lisinopril  (ZESTRIL ) 5 MG tablet Take 5 mg by mouth at bedtime.     loratadine  (CLARITIN ) 10 MG tablet Take 10 mg by mouth 2 (two) times daily.     methocarbamol  (ROBAXIN ) 500 MG tablet Take 1 tablet (500 mg total) by mouth every 8 (eight) hours as needed for muscle spasms. 40 tablet 1   NYSTATIN powder Apply 1 application topically 3 (three) times daily as needed (skin irritation/rash).     omeprazole (PRILOSEC) 20 MG capsule Take 20 mg by mouth daily as needed (acid reflux/indigestion).     polyethylene glycol (MIRALAX  / GLYCOLAX ) 17 g packet Take 17 g by mouth daily as needed (constipation).     No current facility-administered medications on file prior to visit.  [2]  Allergies Allergen Reactions   Other     Tolerates Prednisone in small doses, in higher doses cause palpitations   "

## 2024-05-03 ENCOUNTER — Encounter: Payer: Self-pay | Admitting: Neurology

## 2024-05-03 ENCOUNTER — Ambulatory Visit: Payer: Self-pay | Admitting: Neurology

## 2024-05-03 VITALS — BP 132/76 | HR 106 | Ht 61.5 in | Wt 219.2 lb

## 2024-05-03 DIAGNOSIS — I639 Cerebral infarction, unspecified: Secondary | ICD-10-CM | POA: Diagnosis not present

## 2024-05-03 NOTE — Patient Instructions (Signed)
 Continue aspirin  81mg  daily Will get MRI report Continue cardiac workup

## 2024-05-10 ENCOUNTER — Ambulatory Visit (HOSPITAL_BASED_OUTPATIENT_CLINIC_OR_DEPARTMENT_OTHER)
Admission: RE | Admit: 2024-05-10 | Discharge: 2024-05-10 | Disposition: A | Discharge status: Home / Self Care | Source: Ambulatory Visit | Attending: Nurse Practitioner | Admitting: Nurse Practitioner

## 2024-05-10 DIAGNOSIS — K7469 Other cirrhosis of liver: Secondary | ICD-10-CM | POA: Diagnosis not present

## 2024-06-18 ENCOUNTER — Inpatient Hospital Stay

## 2024-06-18 ENCOUNTER — Inpatient Hospital Stay: Admitting: Oncology

## 2024-06-29 ENCOUNTER — Inpatient Hospital Stay

## 2024-06-29 ENCOUNTER — Inpatient Hospital Stay: Admitting: Oncology

## 2024-11-16 ENCOUNTER — Ambulatory Visit: Payer: Self-pay | Admitting: Neurology
